# Patient Record
Sex: Male | Born: 1985 | Race: Black or African American | Hispanic: No | Marital: Single | Smoking: Current every day smoker
Health system: Southern US, Community
[De-identification: ages and names within clinical notes are randomized; demographics above are authoritative.]

---
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
## 2003-01-14 ENCOUNTER — Emergency Department (HOSPITAL_COMMUNITY): Admission: EM | Admit: 2003-01-14 | Discharge: 2003-01-14 | Payer: Self-pay | Admitting: *Deleted

## 2003-03-12 ENCOUNTER — Emergency Department (HOSPITAL_COMMUNITY): Admission: EM | Admit: 2003-03-12 | Discharge: 2003-03-12 | Payer: Self-pay | Admitting: Emergency Medicine

## 2003-09-17 ENCOUNTER — Emergency Department (HOSPITAL_COMMUNITY): Admission: EM | Admit: 2003-09-17 | Discharge: 2003-09-17 | Payer: Self-pay | Admitting: Emergency Medicine

## 2003-09-18 ENCOUNTER — Emergency Department (HOSPITAL_COMMUNITY): Admission: EM | Admit: 2003-09-18 | Discharge: 2003-09-19 | Payer: Self-pay | Admitting: *Deleted

## 2003-09-24 ENCOUNTER — Emergency Department (HOSPITAL_COMMUNITY): Admission: EM | Admit: 2003-09-24 | Discharge: 2003-09-24 | Payer: Self-pay | Admitting: Emergency Medicine

## 2003-10-04 ENCOUNTER — Emergency Department (HOSPITAL_COMMUNITY): Admission: EM | Admit: 2003-10-04 | Discharge: 2003-10-05 | Payer: Self-pay | Admitting: *Deleted

## 2003-10-08 ENCOUNTER — Emergency Department (HOSPITAL_COMMUNITY): Admission: EM | Admit: 2003-10-08 | Discharge: 2003-10-08 | Payer: Self-pay | Admitting: *Deleted

## 2003-12-07 ENCOUNTER — Emergency Department (HOSPITAL_COMMUNITY): Admission: EM | Admit: 2003-12-07 | Discharge: 2003-12-07 | Payer: Self-pay | Admitting: Emergency Medicine

## 2003-12-12 ENCOUNTER — Inpatient Hospital Stay (HOSPITAL_COMMUNITY): Admission: EM | Admit: 2003-12-12 | Discharge: 2003-12-14 | Payer: Self-pay | Admitting: Emergency Medicine

## 2005-07-15 ENCOUNTER — Emergency Department (HOSPITAL_COMMUNITY): Admission: EM | Admit: 2005-07-15 | Discharge: 2005-07-15 | Payer: Self-pay | Admitting: Emergency Medicine

## 2005-11-28 ENCOUNTER — Emergency Department (HOSPITAL_COMMUNITY): Admission: EM | Admit: 2005-11-28 | Discharge: 2005-11-28 | Payer: Self-pay | Admitting: Emergency Medicine

## 2006-08-31 ENCOUNTER — Emergency Department (HOSPITAL_COMMUNITY): Admission: EM | Admit: 2006-08-31 | Discharge: 2006-08-31 | Payer: Self-pay | Admitting: Emergency Medicine

## 2007-03-04 ENCOUNTER — Emergency Department (HOSPITAL_COMMUNITY): Admission: EM | Admit: 2007-03-04 | Discharge: 2007-03-04 | Payer: Self-pay | Admitting: Emergency Medicine

## 2007-06-03 ENCOUNTER — Emergency Department (HOSPITAL_COMMUNITY): Admission: EM | Admit: 2007-06-03 | Discharge: 2007-06-03 | Payer: Self-pay | Admitting: Emergency Medicine

## 2007-06-12 ENCOUNTER — Emergency Department (HOSPITAL_COMMUNITY): Admission: EM | Admit: 2007-06-12 | Discharge: 2007-06-12 | Payer: Self-pay | Admitting: Emergency Medicine

## 2007-12-01 ENCOUNTER — Emergency Department (HOSPITAL_COMMUNITY): Admission: EM | Admit: 2007-12-01 | Discharge: 2007-12-02 | Payer: Self-pay | Admitting: Emergency Medicine

## 2008-08-28 ENCOUNTER — Emergency Department (HOSPITAL_COMMUNITY): Admission: EM | Admit: 2008-08-28 | Discharge: 2008-08-28 | Payer: Self-pay | Admitting: Emergency Medicine

## 2008-11-08 ENCOUNTER — Emergency Department (HOSPITAL_COMMUNITY): Admission: EM | Admit: 2008-11-08 | Discharge: 2008-11-08 | Payer: Self-pay | Admitting: Emergency Medicine

## 2008-11-08 ENCOUNTER — Inpatient Hospital Stay (HOSPITAL_COMMUNITY): Admission: AD | Admit: 2008-11-08 | Discharge: 2008-11-09 | Payer: Self-pay | Admitting: *Deleted

## 2008-11-08 ENCOUNTER — Ambulatory Visit: Payer: Self-pay | Admitting: *Deleted

## 2009-01-01 ENCOUNTER — Emergency Department (HOSPITAL_COMMUNITY): Admission: EM | Admit: 2009-01-01 | Discharge: 2009-01-01 | Payer: Self-pay | Admitting: Emergency Medicine

## 2009-02-28 ENCOUNTER — Emergency Department (HOSPITAL_COMMUNITY): Admission: EM | Admit: 2009-02-28 | Discharge: 2009-02-28 | Payer: Self-pay | Admitting: Emergency Medicine

## 2009-10-17 ENCOUNTER — Emergency Department (HOSPITAL_COMMUNITY): Admission: EM | Admit: 2009-10-17 | Discharge: 2009-10-17 | Payer: Self-pay | Admitting: Emergency Medicine

## 2010-05-08 ENCOUNTER — Emergency Department (HOSPITAL_COMMUNITY): Admission: EM | Admit: 2010-05-08 | Discharge: 2010-05-08 | Payer: Self-pay | Admitting: Emergency Medicine

## 2010-10-06 LAB — COMPREHENSIVE METABOLIC PANEL
ALT: 26 U/L (ref 0–53)
Alkaline Phosphatase: 77 U/L (ref 39–117)
CO2: 23 mEq/L (ref 19–32)
Chloride: 107 mEq/L (ref 96–112)
GFR calc non Af Amer: >60 mL/min (ref >60)
Glucose, Bld: 114 mg/dL — ABNORMAL HIGH (ref 70–99)
Potassium: 3.4 mEq/L — ABNORMAL LOW (ref 3.5–5.1)
Sodium: 138 mEq/L (ref 135–145)

## 2010-10-06 LAB — DIFFERENTIAL
Basophils Absolute: 0.1 10*3/uL (ref 0.0–0.1)
Lymphocytes Relative: 14 % (ref 12–46)
Neutro Abs: 7.4 10*3/uL (ref 1.7–7.7)

## 2010-10-06 LAB — CBC
Hemoglobin: 15.5 g/dL (ref 13.0–17.0)
Platelets: 217 10*3/uL (ref 150–400)
RDW: 12.7 % (ref 11.5–15.5)
WBC: 9.3 10*3/uL (ref 4.0–10.5)

## 2010-10-06 LAB — SALICYLATE LEVEL: Salicylate Lvl: <4 mg/dL (ref 2.8–20.0)

## 2010-10-06 LAB — RAPID URINE DRUG SCREEN, HOSP PERFORMED
Barbiturates: NOT DETECTED
Benzodiazepines: NOT DETECTED
Cocaine: POSITIVE — AB

## 2010-10-06 LAB — POCT CARDIAC MARKERS: Troponin i, poc: <0.05 ng/mL (ref 0.00–0.09)

## 2010-10-09 LAB — RAPID URINE DRUG SCREEN, HOSP PERFORMED
Benzodiazepines: NOT DETECTED
Cocaine: NOT DETECTED
Tetrahydrocannabinol: POSITIVE — AB

## 2010-10-09 LAB — URINALYSIS, ROUTINE W REFLEX MICROSCOPIC
Glucose, UA: NEGATIVE mg/dL
Ketones, ur: NEGATIVE mg/dL
Leukocytes, UA: NEGATIVE
Protein, ur: NEGATIVE mg/dL

## 2010-10-09 LAB — BASIC METABOLIC PANEL
CO2: 22 mEq/L (ref 19–32)
Chloride: 108 mEq/L (ref 96–112)
Creatinine, Ser: 0.87 mg/dL (ref 0.4–1.5)
GFR calc Af Amer: >60 mL/min (ref >60)
Potassium: 3.5 mEq/L (ref 3.5–5.1)
Sodium: 137 mEq/L (ref 135–145)

## 2010-10-09 LAB — DIFFERENTIAL
Basophils Relative: 1 % (ref 0–1)
Eosinophils Absolute: 0.1 10*3/uL (ref 0.0–0.7)
Eosinophils Relative: 2 % (ref 0–5)
Monocytes Absolute: 0.6 10*3/uL (ref 0.1–1.0)
Monocytes Relative: 7 % (ref 3–12)

## 2010-10-09 LAB — CBC
HCT: 43.5 % (ref 39.0–52.0)
Hemoglobin: 15 g/dL (ref 13.0–17.0)
MCHC: 34.5 g/dL (ref 30.0–36.0)
MCV: 96.2 fL (ref 78.0–100.0)
RBC: 4.52 MIL/uL (ref 4.22–5.81)

## 2010-11-11 ENCOUNTER — Emergency Department (HOSPITAL_COMMUNITY): Payer: Self-pay

## 2010-11-11 ENCOUNTER — Emergency Department (HOSPITAL_COMMUNITY)
Admission: EM | Admit: 2010-11-11 | Discharge: 2010-11-11 | Disposition: A | Discharge status: Home / Self Care | Payer: Self-pay | Attending: Emergency Medicine | Admitting: Emergency Medicine

## 2010-11-11 DIAGNOSIS — W268XXA Contact with other sharp object(s), not elsewhere classified, initial encounter: Secondary | ICD-10-CM | POA: Insufficient documentation

## 2010-11-11 DIAGNOSIS — Z1881 Retained glass fragments: Secondary | ICD-10-CM | POA: Insufficient documentation

## 2010-11-11 DIAGNOSIS — S61409A Unspecified open wound of unspecified hand, initial encounter: Secondary | ICD-10-CM | POA: Insufficient documentation

## 2010-11-11 DIAGNOSIS — Y92009 Unspecified place in unspecified non-institutional (private) residence as the place of occurrence of the external cause: Secondary | ICD-10-CM | POA: Insufficient documentation
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
## 2010-12-16 ENCOUNTER — Inpatient Hospital Stay (HOSPITAL_COMMUNITY)
Admission: EM | Admit: 2010-12-16 | Discharge: 2010-12-20 | DRG: 132 | Disposition: A | Discharge status: Home / Self Care | Payer: Self-pay | Attending: Oral Surgery | Admitting: Oral Surgery

## 2010-12-16 DIAGNOSIS — S01502A Unspecified open wound of oral cavity, initial encounter: Secondary | ICD-10-CM | POA: Diagnosis present

## 2010-12-16 DIAGNOSIS — F17200 Nicotine dependence, unspecified, uncomplicated: Secondary | ICD-10-CM | POA: Diagnosis present

## 2010-12-16 DIAGNOSIS — S025XXA Fracture of tooth (traumatic), initial encounter for closed fracture: Secondary | ICD-10-CM | POA: Diagnosis present

## 2010-12-16 DIAGNOSIS — Y929 Unspecified place or not applicable: Secondary | ICD-10-CM

## 2010-12-16 DIAGNOSIS — S02600A Fracture of unspecified part of body of mandible, initial encounter for closed fracture: Principal | ICD-10-CM | POA: Diagnosis present

## 2010-12-16 LAB — BASIC METABOLIC PANEL
BUN: 9 mg/dL (ref 6–23)
Calcium: 9.5 mg/dL (ref 8.4–10.5)
Creatinine, Ser: 0.87 mg/dL (ref 0.50–1.35)
GFR calc Af Amer: >60 mL/min (ref >60)

## 2010-12-16 LAB — CBC
HCT: 45.6 % (ref 39.0–52.0)
MCH: 32.4 pg (ref 26.0–34.0)
MCV: 93.1 fL (ref 78.0–100.0)
Platelets: 230 10*3/uL (ref 150–400)
RDW: 12.4 % (ref 11.5–15.5)

## 2010-12-17 LAB — MRSA PCR SCREENING: MRSA by PCR: NEGATIVE
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
## 2010-12-19 ENCOUNTER — Inpatient Hospital Stay (HOSPITAL_COMMUNITY): Payer: Self-pay

## 2010-12-19 NOTE — Op Note (Signed)
NAMEOVADIA, LOPP NO.:  [id number]  MEDICAL RECORD NO.:  [id number]  LOCATION:  5037                         FACILITY:  [facility]  PHYSICIAN:  Georgia Lopes, M.D.  DATE OF BIRTH:  08/06/1985  DATE OF PROCEDURE:  12/18/2010 DATE OF DISCHARGE:                              OPERATIVE REPORT   PREOPERATIVE DIAGNOSIS:  Fractured left mandibular body and impacted tooth #17.  POSTOPERATIVE DIAGNOSIS:  Fractured left mandibular body and impacted tooth #17.  PROCEDURE:  Open reduction and internal fixation, left mandibular fracture, and extraction of tooth #17.  SURGEON:  Georgia Lopes, MD  ANESTHESIA:  General, Dr. Sampson Goon attending.  ASSISTANTS: 1. Luberta Mutter, DOMA 2. Jamie Cimler, DOMA  PROCEDURE IN DETAIL:  The patient was taken to the operating room and placed on the table in a supine position.  General anesthesia was administered intravenously and nasal endotracheal tube was placed and secured.  The eyes were protected.  The patient was prepped and draped for the procedure.  The oral portion was done first.  Posterior pharynx was suctioned and a throat pack was placed.  Lidocaine 2% with 1:100,000 epinephrine was infiltrated in the inferior alveolar block and buccal infiltration on the left mandible, total of 3 mL and then injection was made in the right and left maxillary piriform rim regions about the canine and in the mandible, in the area of the canine, and the buccal vestibule, a total of 10 mL was used for the oral injection.  The #15 blade was used to make a 1-cm incision at the right and left canine fossa area through mucosa down to the bone.  The periosteum was reflected with a Therapist, nutritional.  In the mandible, #15 blade was used to make an incision in the space between tooth #21, #22 near the attached mucosal tissue.  This incision was created also on the right side between teeth numbers 27 and 28.  The periosteum was reflected with  a Therapist, nutritional.  Then, with a Leibinger, maxillomandibular fixation screws 10 mm were placed in each of the dissected sites with the screwdriver, these were self-tapping screws.  Then a #15 blade was used to make a full-thickness incision overlying tooth #17 which was partially erupted.  The periosteum was reflected.  There was an obvious fracture noted at the alveolar buccal ridge.  The tooth was removed by removing bone around the tooth, elevating it with a 301 elevator, and then using the lower #150 forceps to remove it.  Socket was curetted and irrigated.  Then, the mandible was placed into good occlusion with the maxilla and 24-gauge wires were placed through the maxillomandibular fixation screw pins on the right and left side to place the mouth into occlusion and wire it shut.  Then, the patient's head was turned slightly and a marking pen was used to demarcate the palpated angle of the mandible and the body of the mandible where the fracture was felt to be.  Then, another line was made parallel to this, approximately 2 cm inferior and approximately 8 cm in length with 4 cm on each side where the fracture was felt to be.  Then, 2% lidocaine 1:100,000 epinephrine was infiltrated in this proposed incision site and good soft tissue sent to the periosteum and the bone area.  A total of 10 mL was utilized. The #15 blade was used make full-thickness incision through the skin and subcutaneous tissues.  The platysma was identified.  Dissection was carried into the platysma and a nerve stimulator was used to check for the marginal mandibular branch with facial nerve and the nerve was not identified.  There was no twitch and this muscle was sectioned with the #15 blade.  The dissection was carried through the soft tissues until the periosteum of the mandible was reached.  The periosteum was incised sharply with the #15 blade.  Periosteal elevator was used to reflect the periosteum and the  fracture was identified.  Dissection was carried out so that the inferior border of the mandible and a portion of the body of the mandible was freed out proximally and distally to the fracture. Then Kocher bone clamps were used and the fracture was found to reduce nicely.  Then, a 6-hole mandibular bone plate Leibinger was adapted to the body of the mandible into the inferior border and then the Stryker handpiece was used to create screw-holes and 2.3 screws were placed, 3 on either side, however, the distal screw closest to the fracture site was found to be freely rotating within its hole and a rescue emergency screw was placed, but this was also not tight, so it was presumed that the hole was too close to the fracture to engage adequate bone. However, the bone did reduce nicely after the screws were placed and then the incision area was irrigated, closed with 4-0 Vicryl, 3-0 chromic, and 4-0 Prolene for skin.  Then attention was turned back into the mouth.  The occlusion was wired together and these looked good. Then the intermaxillary fixation wires were cut and the mouth was opened.  The third molar socket was inspected and the superior border of mandible fracture site appeared to have near perfect reduction, so no additional work was done in this area.  The third molar site was irrigated and then closed with 3-0 chromic.  The incision sites where the maxillary fixation pins were placed were then noted and screwdriver was used to remove the maxillomandibular fixation.  Then these incisions were closed with 3-0 chromic.  Then the oral cavity was irrigated copiously.  Occlusion was checked one last time and found to be good. The throat pack was removed after suctioning the pharynx.  A sterile dressing was placed with bacitracin, Telfa, and Elastoplast and then the patient was extubated, taken to the recovery room breathing spontaneously in good condition.  ESTIMATED BLOOD LOSS:   Minimum.  COMPLICATIONS:  None.  SPECIMENS:  None.     Georgia Lopes, M.D.     SMJ/MEDQ  D:  12/18/2010  T:  12/19/2010  Job:  [id number]  Electronically Signed by Ocie Doyne M.D. on 12/19/2010 07:55:02 AM
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
## 2010-12-26 ENCOUNTER — Other Ambulatory Visit (HOSPITAL_COMMUNITY): Payer: Self-pay | Admitting: Oral Surgery

## 2010-12-26 DIAGNOSIS — R609 Edema, unspecified: Secondary | ICD-10-CM

## 2010-12-28 ENCOUNTER — Other Ambulatory Visit (HOSPITAL_COMMUNITY): Payer: Self-pay

## 2011-01-01 ENCOUNTER — Ambulatory Visit (HOSPITAL_COMMUNITY)
Admission: RE | Admit: 2011-01-01 | Discharge: 2011-01-01 | Disposition: A | Discharge status: Home / Self Care | Payer: Self-pay | Source: Ambulatory Visit | Attending: Oral Surgery | Admitting: Oral Surgery

## 2011-01-01 ENCOUNTER — Encounter (HOSPITAL_COMMUNITY): Payer: Self-pay

## 2011-01-01 ENCOUNTER — Other Ambulatory Visit (HOSPITAL_COMMUNITY): Payer: Self-pay

## 2011-01-01 DIAGNOSIS — R609 Edema, unspecified: Secondary | ICD-10-CM

## 2011-01-01 DIAGNOSIS — R209 Unspecified disturbances of skin sensation: Secondary | ICD-10-CM | POA: Insufficient documentation

## 2011-01-01 DIAGNOSIS — R220 Localized swelling, mass and lump, head: Secondary | ICD-10-CM | POA: Insufficient documentation

## 2011-01-01 MED ORDER — IOHEXOL 300 MG/ML  SOLN: 100.0000 mL | Freq: Once | INTRAMUSCULAR | Status: AC | PRN

## 2011-01-22 ENCOUNTER — Other Ambulatory Visit (HOSPITAL_COMMUNITY): Payer: Self-pay | Admitting: Oral Surgery

## 2011-01-22 DIAGNOSIS — B999 Unspecified infectious disease: Secondary | ICD-10-CM

## 2011-01-24 ENCOUNTER — Ambulatory Visit (HOSPITAL_COMMUNITY)
Admission: RE | Admit: 2011-01-24 | Discharge: 2011-01-24 | Disposition: A | Discharge status: Home / Self Care | Payer: Self-pay | Source: Ambulatory Visit | Attending: Oral Surgery | Admitting: Oral Surgery

## 2011-01-24 DIAGNOSIS — B999 Unspecified infectious disease: Secondary | ICD-10-CM

## 2011-01-24 DIAGNOSIS — R609 Edema, unspecified: Secondary | ICD-10-CM | POA: Insufficient documentation

## 2011-01-24 DIAGNOSIS — R220 Localized swelling, mass and lump, head: Secondary | ICD-10-CM | POA: Insufficient documentation

## 2011-01-24 DIAGNOSIS — Y831 Surgical operation with implant of artificial internal device as the cause of abnormal reaction of the patient, or of later complication, without mention of misadventure at the time of the procedure: Secondary | ICD-10-CM | POA: Insufficient documentation

## 2011-01-24 DIAGNOSIS — T8140XA Infection following a procedure, unspecified, initial encounter: Secondary | ICD-10-CM | POA: Insufficient documentation

## 2011-01-24 MED ORDER — IOHEXOL 300 MG/ML  SOLN
80.0000 mL | Freq: Once | INTRAMUSCULAR | Status: AC | PRN
  Administered 2011-01-24: 80 mL via INTRAVENOUS

## 2011-02-07 ENCOUNTER — Encounter (HOSPITAL_COMMUNITY)
Admission: RE | Admit: 2011-02-07 | Discharge: 2011-02-07 | Disposition: A | Discharge status: Home / Self Care | Payer: Self-pay | Source: Ambulatory Visit | Attending: Oral Surgery | Admitting: Oral Surgery

## 2011-02-07 ENCOUNTER — Other Ambulatory Visit (HOSPITAL_COMMUNITY): Payer: Self-pay | Admitting: Oral Surgery

## 2011-02-07 DIAGNOSIS — Z01812 Encounter for preprocedural laboratory examination: Secondary | ICD-10-CM | POA: Insufficient documentation

## 2011-02-07 DIAGNOSIS — B999 Unspecified infectious disease: Secondary | ICD-10-CM

## 2011-02-07 DIAGNOSIS — Z01818 Encounter for other preprocedural examination: Secondary | ICD-10-CM | POA: Insufficient documentation

## 2011-02-07 LAB — CBC
HCT: 43.4 % (ref 39.0–52.0)
Hemoglobin: 14.8 g/dL (ref 13.0–17.0)
MCV: 94.1 fL (ref 78.0–100.0)
WBC: 5.4 10*3/uL (ref 4.0–10.5)

## 2011-02-08 ENCOUNTER — Ambulatory Visit (HOSPITAL_COMMUNITY)
Admission: RE | Admit: 2011-02-08 | Discharge: 2011-02-08 | Disposition: A | Discharge status: Home / Self Care | Payer: Self-pay | Source: Ambulatory Visit | Attending: Oral Surgery | Admitting: Oral Surgery

## 2011-02-08 DIAGNOSIS — F17200 Nicotine dependence, unspecified, uncomplicated: Secondary | ICD-10-CM | POA: Insufficient documentation

## 2011-02-08 DIAGNOSIS — R220 Localized swelling, mass and lump, head: Secondary | ICD-10-CM | POA: Insufficient documentation

## 2011-02-08 DIAGNOSIS — Z01812 Encounter for preprocedural laboratory examination: Secondary | ICD-10-CM | POA: Insufficient documentation

## 2011-02-08 DIAGNOSIS — Z01818 Encounter for other preprocedural examination: Secondary | ICD-10-CM | POA: Insufficient documentation

## 2011-02-08 DIAGNOSIS — F411 Generalized anxiety disorder: Secondary | ICD-10-CM | POA: Insufficient documentation

## 2011-02-08 DIAGNOSIS — Z01810 Encounter for preprocedural cardiovascular examination: Secondary | ICD-10-CM | POA: Insufficient documentation

## 2011-02-08 DIAGNOSIS — IMO0002 Reserved for concepts with insufficient information to code with codable children: Secondary | ICD-10-CM | POA: Insufficient documentation

## 2011-02-10 LAB — CULTURE, ROUTINE-ABSCESS
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
## 2011-02-13 LAB — ANAEROBIC CULTURE

## 2011-03-28 LAB — DIFFERENTIAL
Basophils Absolute: 0
Basophils Relative: 0
Eosinophils Absolute: 0.1
Eosinophils Relative: 2
Lymphocytes Relative: 39
Monocytes Absolute: 0.5

## 2011-03-28 LAB — CBC
HCT: 44.6
Hemoglobin: 15.8
MCHC: 35.3
MCV: 95.2
RDW: 12.9

## 2011-03-28 LAB — RAPID URINE DRUG SCREEN, HOSP PERFORMED: Benzodiazepines: NOT DETECTED

## 2011-03-28 LAB — BASIC METABOLIC PANEL
CO2: 24
Chloride: 112
Glucose, Bld: 101 — ABNORMAL HIGH
Potassium: 3.7
Sodium: 143

## 2011-04-08 LAB — CBC
MCV: 95.3
RBC: 4.63
WBC: 6.7

## 2011-04-08 LAB — BASIC METABOLIC PANEL
Chloride: 112
Creatinine, Ser: 0.99
GFR calc Af Amer: >60

## 2011-04-08 LAB — DIFFERENTIAL
Lymphs Abs: 1.7
Monocytes Relative: 10
Neutro Abs: 4.2
Neutrophils Relative %: 63

## 2011-04-08 LAB — RAPID URINE DRUG SCREEN, HOSP PERFORMED
Amphetamines: NOT DETECTED
Benzodiazepines: NOT DETECTED
Cocaine: NOT DETECTED
Opiates: POSITIVE — AB
Tetrahydrocannabinol: POSITIVE — AB

## 2011-04-08 LAB — ETHANOL: Alcohol, Ethyl (B): 223 — ABNORMAL HIGH

## 2011-04-12 LAB — COMPREHENSIVE METABOLIC PANEL
AST: 30
Albumin: 4.7
Calcium: 9.6
Creatinine, Ser: 1.13
GFR calc Af Amer: >60
GFR calc non Af Amer: >60

## 2011-04-12 LAB — DIFFERENTIAL
Lymphocytes Relative: 32
Lymphs Abs: 1.9
Neutrophils Relative %: 56

## 2011-04-12 LAB — CBC
MCHC: 33.7
MCV: 95.6
Platelets: 230
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
## 2014-10-10 ENCOUNTER — Encounter (HOSPITAL_COMMUNITY): Payer: Self-pay | Admitting: Cardiology

## 2014-10-10 ENCOUNTER — Emergency Department (HOSPITAL_COMMUNITY): Payer: Self-pay

## 2014-10-10 ENCOUNTER — Emergency Department (HOSPITAL_COMMUNITY)
Admission: EM | Admit: 2014-10-10 | Discharge: 2014-10-10 | Disposition: A | Discharge status: Home / Self Care | Payer: Self-pay | Attending: Emergency Medicine | Admitting: Emergency Medicine

## 2014-10-10 DIAGNOSIS — Y9289 Other specified places as the place of occurrence of the external cause: Secondary | ICD-10-CM | POA: Insufficient documentation

## 2014-10-10 DIAGNOSIS — S52202A Unspecified fracture of shaft of left ulna, initial encounter for closed fracture: Secondary | ICD-10-CM

## 2014-10-10 DIAGNOSIS — Z79899 Other long term (current) drug therapy: Secondary | ICD-10-CM | POA: Insufficient documentation

## 2014-10-10 DIAGNOSIS — W228XXA Striking against or struck by other objects, initial encounter: Secondary | ICD-10-CM | POA: Insufficient documentation

## 2014-10-10 DIAGNOSIS — Y998 Other external cause status: Secondary | ICD-10-CM | POA: Insufficient documentation

## 2014-10-10 DIAGNOSIS — S51812A Laceration without foreign body of left forearm, initial encounter: Secondary | ICD-10-CM | POA: Insufficient documentation

## 2014-10-10 DIAGNOSIS — S52092A Other fracture of upper end of left ulna, initial encounter for closed fracture: Secondary | ICD-10-CM | POA: Insufficient documentation

## 2014-10-10 DIAGNOSIS — J45909 Unspecified asthma, uncomplicated: Secondary | ICD-10-CM | POA: Insufficient documentation

## 2014-10-10 DIAGNOSIS — Y9389 Activity, other specified: Secondary | ICD-10-CM | POA: Insufficient documentation

## 2014-10-10 MED ORDER — SULFAMETHOXAZOLE-TRIMETHOPRIM 800-160 MG PO TABS
1.0000 | ORAL_TABLET | Freq: Once | ORAL | Status: AC
  Administered 2014-10-10: 1 via ORAL
  Filled 2014-10-10: qty 1

## 2014-10-10 MED ORDER — CEPHALEXIN 500 MG PO CAPS: 500.0000 mg | ORAL_CAPSULE | Freq: Four times a day (QID) | ORAL | Status: DC

## 2014-10-10 MED ORDER — PROMETHAZINE HCL 12.5 MG PO TABS
12.5000 mg | ORAL_TABLET | Freq: Once | ORAL | Status: AC
  Administered 2014-10-10: 12.5 mg via ORAL
  Filled 2014-10-10: qty 1

## 2014-10-10 MED ORDER — SULFAMETHOXAZOLE-TRIMETHOPRIM 800-160 MG PO TABS: 1.0000 | ORAL_TABLET | Freq: Two times a day (BID) | ORAL | Status: AC

## 2014-10-10 MED ORDER — HYDROCODONE-ACETAMINOPHEN 7.5-325 MG PO TABS: 1.0000 | ORAL_TABLET | ORAL | Status: DC | PRN

## 2014-10-10 MED ORDER — IBUPROFEN 800 MG PO TABS
800.0000 mg | ORAL_TABLET | Freq: Once | ORAL | Status: AC
  Administered 2014-10-10: 800 mg via ORAL
  Filled 2014-10-10: qty 1

## 2014-10-10 MED ORDER — HYDROCODONE-ACETAMINOPHEN 5-325 MG PO TABS
2.0000 | ORAL_TABLET | Freq: Once | ORAL | Status: AC
  Administered 2014-10-10: 2 via ORAL
  Filled 2014-10-10: qty 2

## 2014-10-10 MED ORDER — BACITRACIN-NEOMYCIN-POLYMYXIN 400-5-5000 EX OINT
TOPICAL_OINTMENT | CUTANEOUS | Status: AC
  Filled 2014-10-10: qty 1

## 2014-10-10 MED ORDER — CEPHALEXIN 500 MG PO CAPS
500.0000 mg | ORAL_CAPSULE | Freq: Once | ORAL | Status: AC
  Administered 2014-10-10: 500 mg via ORAL
  Filled 2014-10-10: qty 1

## 2014-10-10 MED ORDER — BACITRACIN-NEOMYCIN-POLYMYXIN 400-5-5000 EX OINT
TOPICAL_OINTMENT | Freq: Once | CUTANEOUS | Status: AC
  Administered 2014-10-10: 14:00:00 via TOPICAL
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
## 2014-10-10 NOTE — ED Notes (Signed)
Pt has lac to lt forearm with dried blood around it.  Cleansed
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
## 2014-10-10 NOTE — Discharge Instructions (Signed)
You have a broken bone in your left forearm. You also have a laceration of your forearm. Please use the Bactrim and the Keflex daily with food until all taken. Please see the orthopedist listed above, or the orthopedist of your choice as sone as possible for evaluation of your broken bone. Please apply ice to the area until you're seen by the orthopedic specialist. You may use the ice in 15-20 minute periods. Forearm Fracture Your caregiver has diagnosed you as having a broken bone (fracture) of the forearm. This is the part of your arm between the elbow and your wrist. Your forearm is made up of two bones. These are the radius and ulna. A fracture is a break in one or both bones. A cast or splint is used to protect and keep your injured bone from moving. The cast or splint will be on generally for about 5 to 6 weeks, with individual variations. HOME CARE INSTRUCTIONS   Keep the injured part elevated while sitting or lying down. Keeping the injury above the level of your heart (the center of the chest). This will decrease swelling and pain.  Apply ice to the injury for 15-20 minutes, 03-04 times per day while awake, for 2 days. Put the ice in a plastic bag and place a thin towel between the bag of ice and your cast or splint.  If you have a plaster or fiberglass cast:  Do not try to scratch the skin under the cast using sharp or pointed objects.  Check the skin around the cast every day. You may put lotion on any red or sore areas.  Keep your cast dry and clean.  If you have a plaster splint:  Wear the splint as directed.  You may loosen the elastic around the splint if your fingers become numb, tingle, or turn cold or blue.  Do not put pressure on any part of your cast or splint. It may break. Rest your cast only on a pillow the first 24 hours until it is fully hardened.  Your cast or splint can be protected during bathing with a plastic bag. Do not lower the cast or splint into  water.  Only take over-the-counter or prescription medicines for pain, discomfort, or fever as directed by your caregiver. SEEK IMMEDIATE MEDICAL CARE IF:   Your cast gets damaged or breaks.  You have more severe pain or swelling than you did before the cast.  Your skin or nails below the injury turn blue or gray, or feel cold or numb.  There is a bad smell or new stains and/or pus like (purulent) drainage coming from under the cast. MAKE SURE YOU:   Understand these instructions.  Will watch your condition.  Will get help right away if you are not doing well or get worse. Document Released: 06/14/2000 Document Revised: 09/09/2011 Document Reviewed: 02/04/2008 [facility] Patient Information 2015 [place], [place]. This information is not intended to replace advice given to you by your health care provider. Make sure you discuss any questions you have with your health care provider.  Cast or Splint Care Casts and splints support injured limbs and keep bones from moving while they heal. It is important to care for your cast or splint at home.  HOME CARE INSTRUCTIONS  Keep the cast or splint uncovered during the drying period. It can take 24 to 48 hours to dry if it is made of plaster. A fiberglass cast will dry in less than 1 hour.  Do not  rest the cast on anything harder than a pillow for the first 24 hours.  Do not put weight on your injured limb or apply pressure to the cast until your health care provider gives you permission.  Keep the cast or splint dry. Wet casts or splints can lose their shape and may not support the limb as well. A wet cast that has lost its shape can also create harmful pressure on your skin when it dries. Also, wet skin can become infected.  Cover the cast or splint with a plastic bag when bathing or when out in the rain or snow. If the cast is on the trunk of the body, take sponge baths until the cast is removed.  If your cast does become wet, dry it with a  towel or a blow dryer on the cool setting only.  Keep your cast or splint clean. Soiled casts may be wiped with a moistened cloth.  Do not place any hard or soft foreign objects under your cast or splint, such as cotton, toilet paper, lotion, or powder.  Do not try to scratch the skin under the cast with any object. The object could get stuck inside the cast. Also, scratching could lead to an infection. If itching is a problem, use a blow dryer on a cool setting to relieve discomfort.  Do not trim or cut your cast or remove padding from inside of it.  Exercise all joints next to the injury that are not immobilized by the cast or splint. For example, if you have a long leg cast, exercise the hip joint and toes. If you have an arm cast or splint, exercise the shoulder, elbow, thumb, and fingers.  Elevate your injured arm or leg on 1 or 2 pillows for the first 1 to 3 days to decrease swelling and pain.It is best if you can comfortably elevate your cast so it is higher than your heart. SEEK MEDICAL CARE IF:   Your cast or splint cracks.  Your cast or splint is too tight or too loose.  You have unbearable itching inside the cast.  Your cast becomes wet or develops a soft spot or area.  You have a bad smell coming from inside your cast.  You get an object stuck under your cast.  Your skin around the cast becomes red or raw.  You have new pain or worsening pain after the cast has been applied. SEEK IMMEDIATE MEDICAL CARE IF:   You have fluid leaking through the cast.  You are unable to move your fingers or toes.  You have discolored (blue or white), cool, painful, or very swollen fingers or toes beyond the cast.  You have tingling or numbness around the injured area.  You have severe pain or pressure under the cast.  You have any difficulty with your breathing or have shortness of breath.  You have chest pain. Document Released: 06/14/2000 Document Revised: 04/07/2013 Document  Reviewed: 12/24/2012 [facility] Patient Information 2015 [place]. This information is not intended to replace advice given to you by your health care provider. Make sure you discuss any questions you have with your health care provider.

## 2014-10-25 ENCOUNTER — Encounter (HOSPITAL_COMMUNITY): Payer: Self-pay | Admitting: Emergency Medicine

## 2014-10-25 ENCOUNTER — Emergency Department (HOSPITAL_COMMUNITY)
Admission: EM | Admit: 2014-10-25 | Discharge: 2014-10-25 | Disposition: A | Discharge status: Home / Self Care | Payer: Self-pay | Attending: Emergency Medicine | Admitting: Emergency Medicine

## 2014-10-25 DIAGNOSIS — Z792 Long term (current) use of antibiotics: Secondary | ICD-10-CM | POA: Insufficient documentation

## 2014-10-25 DIAGNOSIS — J45909 Unspecified asthma, uncomplicated: Secondary | ICD-10-CM | POA: Insufficient documentation

## 2014-10-25 DIAGNOSIS — S52202D Unspecified fracture of shaft of left ulna, subsequent encounter for closed fracture with routine healing: Secondary | ICD-10-CM

## 2014-10-25 DIAGNOSIS — S52002D Unspecified fracture of upper end of left ulna, subsequent encounter for closed fracture with routine healing: Secondary | ICD-10-CM | POA: Insufficient documentation

## 2014-10-25 DIAGNOSIS — Z79899 Other long term (current) drug therapy: Secondary | ICD-10-CM | POA: Insufficient documentation

## 2014-10-25 DIAGNOSIS — X58XXXD Exposure to other specified factors, subsequent encounter: Secondary | ICD-10-CM | POA: Insufficient documentation

## 2014-10-25 MED ORDER — OXYCODONE-ACETAMINOPHEN 5-325 MG PO TABS
2.0000 | ORAL_TABLET | Freq: Once | ORAL | Status: AC
  Administered 2014-10-25: 2 via ORAL
  Filled 2014-10-25: qty 2

## 2014-10-25 MED ORDER — OXYCODONE-ACETAMINOPHEN 5-325 MG PO TABS: 1.0000 | ORAL_TABLET | Freq: Four times a day (QID) | ORAL | Status: DC | PRN
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
## 2014-10-25 NOTE — Discharge Instructions (Signed)
Cast or Splint Care °Casts and splints support injured limbs and keep bones from moving while they heal. It is important to care for your cast or splint at home.   °HOME CARE INSTRUCTIONS °· Keep the cast or splint uncovered during the drying period. It can take 24 to 48 hours to dry if it is made of plaster. A fiberglass cast will dry in less than 1 hour. °· Do not rest the cast on anything harder than a pillow for the first 24 hours. °· Do not put weight on your injured limb or apply pressure to the cast until your health care provider gives you permission. °· Keep the cast or splint dry. Wet casts or splints can lose their shape and may not support the limb as well. A wet cast that has lost its shape can also create harmful pressure on your skin when it dries. Also, wet skin can become infected. °¨ Cover the cast or splint with a plastic bag when bathing or when out in the rain or snow. If the cast is on the trunk of the body, take sponge baths until the cast is removed. °¨ If your cast does become wet, dry it with a towel or a blow dryer on the cool setting only. °· Keep your cast or splint clean. Soiled casts may be wiped with a moistened cloth. °· Do not place any hard or soft foreign objects under your cast or splint, such as cotton, toilet paper, lotion, or powder. °· Do not try to scratch the skin under the cast with any object. The object could get stuck inside the cast. Also, scratching could lead to an infection. If itching is a problem, use a blow dryer on a cool setting to relieve discomfort. °· Do not trim or cut your cast or remove padding from inside of it. °· Exercise all joints next to the injury that are not immobilized by the cast or splint. For example, if you have a long leg cast, exercise the hip joint and toes. If you have an arm cast or splint, exercise the shoulder, elbow, thumb, and fingers. °· Elevate your injured arm or leg on 1 or 2 pillows for the first 1 to 3 days to decrease  swelling and pain. It is best if you can comfortably elevate your cast so it is higher than your heart. °SEEK MEDICAL CARE IF:  °· Your cast or splint cracks. °· Your cast or splint is too tight or too loose. °· You have unbearable itching inside the cast. °· Your cast becomes wet or develops a soft spot or area. °· You have a bad smell coming from inside your cast. °· You get an object stuck under your cast. °· Your skin around the cast becomes red or raw. °· You have new pain or worsening pain after the cast has been applied. °SEEK IMMEDIATE MEDICAL CARE IF:  °· You have fluid leaking through the cast. °· You are unable to move your fingers or toes. °· You have discolored (blue or white), cool, painful, or very swollen fingers or toes beyond the cast. °· You have tingling or numbness around the injured area. °· You have severe pain or pressure under the cast. °· You have any difficulty with your breathing or have shortness of breath. °· You have chest pain. °Document Released: 06/14/2000 Document Revised: 04/07/2013 Document Reviewed: 12/24/2012 °[employer]® Patient Information ©2015 [employer]. This information is not intended to replace advice given to you by your health care   provider. Make sure you discuss any questions you have with your health care provider.   RICE: Routine Care for Injuries The routine care of many injuries includes Rest, Ice, Compression, and Elevation (RICE). HOME CARE INSTRUCTIONS  Rest is needed to allow your body to heal. Routine activities can usually be resumed when comfortable. Injured tendons and bones can take up to 6 weeks to heal. Tendons are the cord-like structures that attach muscle to bone.  Ice following an injury helps keep the swelling down and reduces pain.  Put ice in a plastic bag.  Place a towel between your skin and the bag.  Leave the ice on for 15-20 minutes, 3-4 times a day, or as directed by your health care provider. Do this while awake, for the  first 24 to 48 hours. After that, continue as directed by your caregiver.  Compression helps keep swelling down. It also gives support and helps with discomfort. If an elastic bandage has been applied, it should be removed and reapplied every 3 to 4 hours. It should not be applied tightly, but firmly enough to keep swelling down. Watch fingers or toes for swelling, bluish discoloration, coldness, numbness, or excessive pain. If any of these problems occur, remove the bandage and reapply loosely. Contact your caregiver if these problems continue.  Elevation helps reduce swelling and decreases pain. With extremities, such as the arms, hands, legs, and feet, the injured area should be placed near or above the level of the heart, if possible. SEEK IMMEDIATE MEDICAL CARE IF:  You have persistent pain and swelling.  You develop redness, numbness, or unexpected weakness.  Your symptoms are getting worse rather than improving after several days. These symptoms may indicate that further evaluation or further X-rays are needed. Sometimes, X-rays may not show a small broken bone (fracture) until 1 week or 10 days later. Make a follow-up appointment with your caregiver. Ask when your X-ray results will be ready. Make sure you get your X-ray results. Document Released: 09/29/2000 Document Revised: 06/22/2013 Document Reviewed: 11/16/2010 [facility] Patient Information 2015 [place], [place]. This information is not intended to replace advice given to you by your health care provider. Make sure you discuss any questions you have with your health care provider.   Ulnar Fracture You have a fracture (broken bone) of the forearm. This is the part of your arm between the elbow and your wrist. Your forearm is made up of two bones. These are the radius and ulna. Your fracture is in the ulna. This is the bone in your forearm located on the little finger side of your forearm. A cast or splint is used to protect and keep your  injured bone from moving. The cast or splint will be on generally for about 5 to 6 weeks, with individual variations. HOME CARE INSTRUCTIONS   Keep the injured part elevated while sitting or lying down. Keep the injury above the level of your heart (the center of the chest). This will decrease swelling and pain.  Apply ice to the injury for 15-20 minutes, 03-04 times per day while awake, for 2 days. Put the ice in a plastic bag and place a towel between the bag of ice and your cast or splint.  Move your fingers to avoid stiffness and minimize swelling.  If you have a plaster or fiberglass cast:  Do not try to scratch the skin under the cast using sharp or pointed objects.  Check the skin around the cast every day. You may  put lotion on any red or sore areas.  Keep your cast dry and clean.  If you have a plaster splint:  Wear the splint as directed.  You may loosen the elastic around the splint if your fingers become numb, tingle, or turn cold or blue.  Do not put pressure on any part of your cast or splint. It may break. Rest your cast only on a pillow the first 24 hours until it is fully hardened.  Your cast or splint can be protected during bathing with a plastic bag. Do not lower the cast or splint into water.  Only take over-the-counter or prescription medicines for pain, discomfort, or fever as directed by your caregiver. SEEK IMMEDIATE MEDICAL CARE IF:   Your cast gets damaged or breaks.  You have more severe pain or swelling than you did before the cast.  You have severe pain when stretching your fingers.  There is a bad smell or new stains and/or purulent (pus like) drainage coming from under the cast. Document Released: 11/28/2005 Document Revised: 09/09/2011 Document Reviewed: 05/02/2007 [employer] Patient Information 2015 [place]. This information is not intended to replace advice given to you by your health care provider. Make sure you discuss any questions you  have with your health care provider.

## 2014-10-25 NOTE — ED Notes (Signed)
MD at bedside. 
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
## 2014-10-26 ENCOUNTER — Telehealth: Payer: Self-pay | Admitting: Orthopedic Surgery
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
## 2014-10-26 NOTE — Telephone Encounter (Signed)
FYI, thank you.

## 2014-10-26 NOTE — Telephone Encounter (Signed)
Ok   Do you want me to answer a Q? Or is this an FYI???

## 2014-10-27 ENCOUNTER — Ambulatory Visit: Payer: Self-pay | Admitting: Orthopedic Surgery

## 2014-11-17 ENCOUNTER — Ambulatory Visit: Payer: Self-pay | Admitting: Orthopedic Surgery

## 2014-11-23 ENCOUNTER — Encounter: Payer: Self-pay | Admitting: Orthopedic Surgery

## 2014-11-27 ENCOUNTER — Emergency Department (HOSPITAL_COMMUNITY): Payer: Self-pay

## 2014-11-27 ENCOUNTER — Emergency Department (HOSPITAL_COMMUNITY)
Admission: EM | Admit: 2014-11-27 | Discharge: 2014-11-27 | Disposition: A | Discharge status: Home / Self Care | Payer: Self-pay | Attending: Emergency Medicine | Admitting: Emergency Medicine

## 2014-11-27 ENCOUNTER — Encounter (HOSPITAL_COMMUNITY): Payer: Self-pay

## 2014-11-27 DIAGNOSIS — F10929 Alcohol use, unspecified with intoxication, unspecified: Secondary | ICD-10-CM

## 2014-11-27 DIAGNOSIS — Z79899 Other long term (current) drug therapy: Secondary | ICD-10-CM | POA: Insufficient documentation

## 2014-11-27 DIAGNOSIS — W260XXA Contact with knife, initial encounter: Secondary | ICD-10-CM | POA: Insufficient documentation

## 2014-11-27 DIAGNOSIS — Z720 Tobacco use: Secondary | ICD-10-CM | POA: Insufficient documentation

## 2014-11-27 DIAGNOSIS — Y9289 Other specified places as the place of occurrence of the external cause: Secondary | ICD-10-CM | POA: Insufficient documentation

## 2014-11-27 DIAGNOSIS — Y998 Other external cause status: Secondary | ICD-10-CM | POA: Insufficient documentation

## 2014-11-27 DIAGNOSIS — Z792 Long term (current) use of antibiotics: Secondary | ICD-10-CM | POA: Insufficient documentation

## 2014-11-27 DIAGNOSIS — F10120 Alcohol abuse with intoxication, uncomplicated: Secondary | ICD-10-CM | POA: Insufficient documentation

## 2014-11-27 DIAGNOSIS — Y9389 Activity, other specified: Secondary | ICD-10-CM | POA: Insufficient documentation

## 2014-11-27 DIAGNOSIS — S61411A Laceration without foreign body of right hand, initial encounter: Secondary | ICD-10-CM | POA: Insufficient documentation

## 2014-11-27 DIAGNOSIS — R000 Tachycardia, unspecified: Secondary | ICD-10-CM | POA: Insufficient documentation

## 2014-11-27 DIAGNOSIS — J45909 Unspecified asthma, uncomplicated: Secondary | ICD-10-CM | POA: Insufficient documentation

## 2014-11-27 MED ORDER — ACETAMINOPHEN 325 MG PO TABS
650.0000 mg | ORAL_TABLET | Freq: Once | ORAL | Status: AC
  Administered 2014-11-27: 650 mg via ORAL

## 2014-11-27 MED ORDER — IBUPROFEN 400 MG PO TABS
600.0000 mg | ORAL_TABLET | Freq: Once | ORAL | Status: AC
  Administered 2014-11-27: 600 mg via ORAL
  Filled 2014-11-27: qty 2

## 2014-11-27 MED ORDER — ACETAMINOPHEN 325 MG PO TABS
ORAL_TABLET | ORAL | Status: AC
  Filled 2014-11-27: qty 2

## 2014-11-27 MED ORDER — IBUPROFEN 400 MG PO TABS
ORAL_TABLET | ORAL | Status: AC
  Filled 2014-11-27: qty 1

## 2014-11-27 MED ORDER — BUPIVACAINE HCL (PF) 0.5 % IJ SOLN
INTRAMUSCULAR | Status: AC
  Administered 2014-11-27: 03:00:00
  Filled 2014-11-27: qty 30

## 2014-11-27 NOTE — ED Notes (Signed)
Patient via [facility] c/o laceration to right hand. Per ems-large amount of blood loss. Bleeding controled at this time, and dressing in place by EMS. ETOH on board.

## 2014-11-27 NOTE — ED Notes (Addendum)
Pt in and out of room

## 2014-11-27 NOTE — Discharge Instructions (Signed)
Keep the wound clean and dry. Change the dressing daily. You can use triple antibiotic ointment on the wound to help healing and to prevent infection.  Elevate your hand. Use ice packs for pain and to stop bleeding and swelling. You can take ibuprofen 600 mg with acetaminophen 650 mg 4 times a day for pain if needed. The sutures need to be removed in 10 days. You should have your hand rechecked by a hand specialist to evaluate your hand if you continue to have numbness in your 3 fingers, call [facility] to get an appointment.    Sutured Wound Care Sutures are stitches that can be used to close wounds. Caring for your wound can help stop infection and lessen pain. HOME CARE   Rest and raise (elevate) the injured area until the pain and puffiness (swelling) go away.  Only take medicines as told by your doctor.  Clean the wound gently with mild soap and water once a day after the first 2 days. Rinse off the soap. Pat the area dry with a clean towel. Do not rub the wound.  Change the bandage (dressing) as told by your doctor. If the bandage sticks, soak it off with soapy water. Stop using a bandage after 2 days or after the wound stops leaking fluid.  Put cream on the wound as told by your doctor.  Do not stretch the wound.  Drink enough fluids to keep your pee (urine) clear or pale yellow.  See your doctor to have the sutures removed.  Use sunscreen or sunblock on the wound after it heals. GET HELP RIGHT AWAY IF:   Your wound gets red, puffy, hot, or tender.  You have more pain in the wound.  You have a red streak that goes away from the wound.  You see yellowish-white fluid (pus) coming out of the wound.  You have a fever.  You have chills and start to shake.  You notice a bad smell coming from the wound.  Your wound will not stop bleeding. MAKE SURE YOU:   Understand these instructions.  Will watch your condition.  Will get help right away if you are not  doing well or get worse. Document Released: 12/04/2007 Document Revised: 09/09/2011 Document Reviewed: 10/21/2010 [facility] Patient Information 2015 [employer] [place]. This information is not intended to replace advice given to you by your health care provider. Make sure you discuss any questions you have with your health care provider.
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
## 2014-12-30 ENCOUNTER — Encounter (HOSPITAL_COMMUNITY): Payer: Self-pay

## 2014-12-30 ENCOUNTER — Inpatient Hospital Stay (HOSPITAL_COMMUNITY)
Admission: AD | Admit: 2014-12-30 | Discharge: 2014-12-31 | DRG: 897 | Disposition: A | Discharge status: Home / Self Care | Payer: No Typology Code available for payment source | Source: Intra-hospital | Attending: Psychiatry | Admitting: Psychiatry

## 2014-12-30 ENCOUNTER — Emergency Department (HOSPITAL_COMMUNITY)
Admission: EM | Admit: 2014-12-30 | Discharge: 2014-12-30 | Disposition: A | Discharge status: Other Acute Inpatient Hospital | Payer: Self-pay | Attending: Emergency Medicine | Admitting: Emergency Medicine

## 2014-12-30 DIAGNOSIS — F1210 Cannabis abuse, uncomplicated: Secondary | ICD-10-CM | POA: Insufficient documentation

## 2014-12-30 DIAGNOSIS — F10129 Alcohol abuse with intoxication, unspecified: Secondary | ICD-10-CM | POA: Diagnosis present

## 2014-12-30 DIAGNOSIS — F10120 Alcohol abuse with intoxication, uncomplicated: Secondary | ICD-10-CM | POA: Insufficient documentation

## 2014-12-30 DIAGNOSIS — F329 Major depressive disorder, single episode, unspecified: Secondary | ICD-10-CM | POA: Diagnosis present

## 2014-12-30 DIAGNOSIS — X58XXXA Exposure to other specified factors, initial encounter: Secondary | ICD-10-CM | POA: Insufficient documentation

## 2014-12-30 DIAGNOSIS — Y906 Blood alcohol level of 120-199 mg/100 ml: Secondary | ICD-10-CM | POA: Diagnosis present

## 2014-12-30 DIAGNOSIS — Z792 Long term (current) use of antibiotics: Secondary | ICD-10-CM | POA: Insufficient documentation

## 2014-12-30 DIAGNOSIS — T424X4A Poisoning by benzodiazepines, undetermined, initial encounter: Secondary | ICD-10-CM | POA: Insufficient documentation

## 2014-12-30 DIAGNOSIS — J45909 Unspecified asthma, uncomplicated: Secondary | ICD-10-CM | POA: Insufficient documentation

## 2014-12-30 DIAGNOSIS — Y9289 Other specified places as the place of occurrence of the external cause: Secondary | ICD-10-CM | POA: Insufficient documentation

## 2014-12-30 DIAGNOSIS — Y998 Other external cause status: Secondary | ICD-10-CM | POA: Insufficient documentation

## 2014-12-30 DIAGNOSIS — F17210 Nicotine dependence, cigarettes, uncomplicated: Secondary | ICD-10-CM | POA: Diagnosis present

## 2014-12-30 DIAGNOSIS — T50904A Poisoning by unspecified drugs, medicaments and biological substances, undetermined, initial encounter: Secondary | ICD-10-CM

## 2014-12-30 DIAGNOSIS — Z79899 Other long term (current) drug therapy: Secondary | ICD-10-CM | POA: Insufficient documentation

## 2014-12-30 DIAGNOSIS — F10920 Alcohol use, unspecified with intoxication, uncomplicated: Secondary | ICD-10-CM

## 2014-12-30 DIAGNOSIS — Z720 Tobacco use: Secondary | ICD-10-CM | POA: Insufficient documentation

## 2014-12-30 DIAGNOSIS — Y9389 Activity, other specified: Secondary | ICD-10-CM | POA: Insufficient documentation

## 2014-12-30 DIAGNOSIS — F1010 Alcohol abuse, uncomplicated: Secondary | ICD-10-CM | POA: Diagnosis present

## 2014-12-30 LAB — CBC WITH DIFFERENTIAL/PLATELET
Basophils Absolute: 0 10*3/uL (ref 0.0–0.1)
Basophils Relative: 1 % (ref 0–1)
Eosinophils Absolute: 0.2 10*3/uL (ref 0.0–0.7)
Eosinophils Relative: 4 % (ref 0–5)
HCT: 43.5 % (ref 39.0–52.0)
Hemoglobin: 14.4 g/dL (ref 13.0–17.0)
Lymphocytes Relative: 35 % (ref 12–46)
Lymphs Abs: 1.8 10*3/uL (ref 0.7–4.0)
MCH: 31.6 pg (ref 26.0–34.0)
MCHC: 33.1 g/dL (ref 30.0–36.0)
MCV: 95.6 fL (ref 78.0–100.0)
Monocytes Absolute: 0.3 10*3/uL (ref 0.1–1.0)
Monocytes Relative: 6 % (ref 3–12)
Neutro Abs: 2.9 10*3/uL (ref 1.7–7.7)
Neutrophils Relative %: 54 % (ref 43–77)
Platelets: 202 10*3/uL (ref 150–400)
RBC: 4.55 MIL/uL (ref 4.22–5.81)
RDW: 12.9 % (ref 11.5–15.5)
WBC: 5.3 10*3/uL (ref 4.0–10.5)

## 2014-12-30 LAB — BASIC METABOLIC PANEL
Anion gap: 6 (ref 5–15)
BUN: 7 mg/dL (ref 6–20)
CO2: 24 mmol/L (ref 22–32)
Calcium: 8.7 mg/dL — ABNORMAL LOW (ref 8.9–10.3)
Chloride: 109 mmol/L (ref 101–111)
Creatinine, Ser: 0.92 mg/dL (ref 0.61–1.24)
GFR calc Af Amer: >60 mL/min (ref >60)
GFR calc non Af Amer: >60 mL/min (ref >60)
Glucose, Bld: 102 mg/dL — ABNORMAL HIGH (ref 65–99)
Potassium: 3.4 mmol/L — ABNORMAL LOW (ref 3.5–5.1)
Sodium: 139 mmol/L (ref 135–145)

## 2014-12-30 LAB — RAPID URINE DRUG SCREEN, HOSP PERFORMED
Amphetamines: NOT DETECTED
Barbiturates: NOT DETECTED
Benzodiazepines: NOT DETECTED
Cocaine: NOT DETECTED
Opiates: NOT DETECTED
Tetrahydrocannabinol: POSITIVE — AB

## 2014-12-30 LAB — ETHANOL: Alcohol, Ethyl (B): 124 mg/dL — ABNORMAL HIGH (ref <5)

## 2014-12-30 MED ORDER — MAGNESIUM HYDROXIDE 400 MG/5ML PO SUSP: 30.0000 mL | Freq: Every day | ORAL | Status: DC | PRN

## 2014-12-30 MED ORDER — LOPERAMIDE HCL 2 MG PO CAPS: 2.0000 mg | ORAL_CAPSULE | ORAL | Status: DC | PRN

## 2014-12-30 MED ORDER — NICOTINE 21 MG/24HR TD PT24
21.0000 mg | MEDICATED_PATCH | Freq: Every day | TRANSDERMAL | Status: DC
  Filled 2014-12-30: qty 1

## 2014-12-30 MED ORDER — TRAZODONE HCL 50 MG PO TABS
50.0000 mg | ORAL_TABLET | Freq: Every evening | ORAL | Status: DC | PRN
  Administered 2014-12-30: 50 mg via ORAL
  Filled 2014-12-30: qty 1

## 2014-12-30 MED ORDER — CHLORDIAZEPOXIDE HCL 25 MG PO CAPS: 25.0000 mg | ORAL_CAPSULE | Freq: Four times a day (QID) | ORAL | Status: DC | PRN

## 2014-12-30 MED ORDER — THIAMINE HCL 100 MG/ML IJ SOLN
100.0000 mg | Freq: Once | INTRAMUSCULAR | Status: AC
  Administered 2014-12-30: 100 mg via INTRAMUSCULAR
  Filled 2014-12-30: qty 2

## 2014-12-30 MED ORDER — ALUM & MAG HYDROXIDE-SIMETH 200-200-20 MG/5ML PO SUSP: 30.0000 mL | ORAL | Status: DC | PRN

## 2014-12-30 MED ORDER — LORAZEPAM 1 MG PO TABS
1.0000 mg | ORAL_TABLET | Freq: Once | ORAL | Status: AC
  Administered 2014-12-30: 1 mg via ORAL
  Filled 2014-12-30: qty 1

## 2014-12-30 MED ORDER — NICOTINE 21 MG/24HR TD PT24
21.0000 mg | MEDICATED_PATCH | Freq: Every day | TRANSDERMAL | Status: DC
  Filled 2014-12-30 (×4): qty 1

## 2014-12-30 MED ORDER — ONDANSETRON 4 MG PO TBDP: 4.0000 mg | ORAL_TABLET | Freq: Four times a day (QID) | ORAL | Status: DC | PRN

## 2014-12-30 MED ORDER — CHLORDIAZEPOXIDE HCL 25 MG PO CAPS: 25.0000 mg | ORAL_CAPSULE | ORAL | Status: DC

## 2014-12-30 MED ORDER — POVIDONE-IODINE 10 % EX SOLN
CUTANEOUS | Status: AC
  Filled 2014-12-30: qty 118

## 2014-12-30 MED ORDER — CHLORDIAZEPOXIDE HCL 25 MG PO CAPS: 25.0000 mg | ORAL_CAPSULE | Freq: Every day | ORAL | Status: DC

## 2014-12-30 MED ORDER — VITAMIN B-1 100 MG PO TABS
100.0000 mg | ORAL_TABLET | Freq: Every day | ORAL | Status: DC
  Administered 2014-12-31: 100 mg via ORAL
  Filled 2014-12-30 (×3): qty 1

## 2014-12-30 MED ORDER — ADULT MULTIVITAMIN W/MINERALS CH
1.0000 | ORAL_TABLET | Freq: Every day | ORAL | Status: DC
  Administered 2014-12-30 – 2014-12-31 (×2): 1 via ORAL
  Filled 2014-12-30 (×5): qty 1

## 2014-12-30 MED ORDER — HYDROXYZINE HCL 25 MG PO TABS
25.0000 mg | ORAL_TABLET | Freq: Four times a day (QID) | ORAL | Status: DC | PRN
  Administered 2014-12-30 – 2014-12-31 (×2): 25 mg via ORAL
  Filled 2014-12-30 (×2): qty 1

## 2014-12-30 MED ORDER — ALBUTEROL SULFATE HFA 108 (90 BASE) MCG/ACT IN AERS: 1.0000 | INHALATION_SPRAY | Freq: Four times a day (QID) | RESPIRATORY_TRACT | Status: DC | PRN

## 2014-12-30 MED ORDER — CHLORDIAZEPOXIDE HCL 25 MG PO CAPS
25.0000 mg | ORAL_CAPSULE | Freq: Four times a day (QID) | ORAL | Status: DC
  Administered 2014-12-30 – 2014-12-31 (×4): 25 mg via ORAL
  Filled 2014-12-30 (×5): qty 1

## 2014-12-30 MED ORDER — CHLORDIAZEPOXIDE HCL 25 MG PO CAPS: 25.0000 mg | ORAL_CAPSULE | Freq: Three times a day (TID) | ORAL | Status: DC

## 2014-12-30 MED ORDER — ACETAMINOPHEN 325 MG PO TABS: 650.0000 mg | ORAL_TABLET | Freq: Four times a day (QID) | ORAL | Status: DC | PRN

## 2014-12-30 NOTE — BH Assessment (Signed)
Unable to complete assessment at this time. Spoke with Jonny RuizJohn at [facility] who reported that pt is asleep and shared that pt slept through a lab draw. This Clinical research associatewriter inquired about pt receiving sleeping medication. Pt was not given any medication for sleep; however it has been documented that pt reported taking 20 Klonopin prior to arriving in the ED. TTS will assess pt when pt is alert and able to participate in the assessment.

## 2014-12-30 NOTE — ED Notes (Signed)
Sutures removed from patients right hand. Patient states sutures are two weeks old, hand evaluated by EDP. Sutures removed without difficulty.

## 2014-12-30 NOTE — ED Notes (Signed)
Patient wanting to go outside and smoke. Patient advised of policies. Patient also asking if he can go home as he feels he can manage stress at home. Pt. Finished telepych and [place]

## 2014-12-30 NOTE — ED Notes (Signed)
Pt reports that he took 20 "of the yellow Klonopin".
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
## 2014-12-30 NOTE — ED Notes (Signed)
[facility].
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
## 2014-12-30 NOTE — ED Notes (Signed)
Pt has denied being suicidal or homicidal. States he just took the pills "because I'm depressed".

## 2014-12-30 NOTE — Progress Notes (Signed)
Did not attend group tonight, visitor came later and was in the room talking.

## 2014-12-30 NOTE — ED Notes (Signed)
Spoke with TTS.  Telepsych assessment to be completed after 7 am.

## 2014-12-30 NOTE — ED Notes (Signed)
Pt states he klonopin pills x 20.   Pt states he is suicidal and homicidal.

## 2014-12-30 NOTE — Progress Notes (Signed)
CSW informed ED staff the telepsych cart will be called in 5 minutes.     Maryelizabeth Rowanressa Tavari Loadholt, MSW, LCSW, LCAS Clinical Social Worker [phone]

## 2014-12-30 NOTE — Progress Notes (Signed)
TTS assessment is pending- CSW contacted [facility] ED Nurse who advised patient is awake and alert to participate in tele psych eval. TTS staff advised-  Reece LevyJanet Corean Yoshimura, MSW, Theresia MajorsLCSWA  [phone]

## 2014-12-30 NOTE — ED Provider Notes (Signed)
Plan is for [facility] to [facility] once a bed is available.  They expect that to happen this afternoon.  I was asked to re enter his consult order.  Linwood DibblesJon Babak Lucus, MD 12/30/14 1359

## 2014-12-30 NOTE — ED Provider Notes (Signed)
CSN: [id number]     Arrival date & time 12/30/14  0217 History   First MD Initiated Contact with Patient 12/30/14 [phone]     Chief Complaint  Patient presents with  . V70.1     (Consider location/radiation/quality/duration/timing/severity/associated sxs/prior Treatment) HPI   29yM with alcohol abuse. Says he wants help. Last drank earlier today. Also reports took 20 klonopin. Wont tell me specifically what his intent was with this. Endorses depression. Also says "I want to kill everyone." Pt has very slurred speech and laughing inappropriately consistent with heavy alcohol intoxication. Unreliable historian currently.   Past Medical History  Diagnosis Date  . Asthma    History reviewed. No pertinent past surgical history. No family history on file. History  Substance Use Topics  . Smoking status: Current Every Day Smoker -- 1.00 packs/day    Types: Cigarettes  . Smokeless tobacco: Not on file  . Alcohol Use: Yes     Comment: drunk now    Review of Systems  Level 5 caveat because patient is intoxicated  Allergies  Review of patient's allergies indicates no known allergies.  Home Medications   Prior to Admission medications   Medication Sig Start Date End Date Taking? Authorizing Provider  albuterol (PROVENTIL HFA;VENTOLIN HFA) 108 (90 BASE) MCG/ACT inhaler Inhale 1-2 puffs into the lungs every 6 (six) hours as needed for wheezing or shortness of breath.   Yes Historical Provider, MD  cephALEXin (KEFLEX) 500 MG capsule Take 1 capsule (500 mg total) by mouth 4 (four) times daily. 10/10/14   Ivery Quale, PA-C  HYDROcodone-acetaminophen (NORCO) 7.5-325 MG per tablet Take 1 tablet by mouth every 4 (four) hours as needed. 10/10/14   Ivery Quale, PA-C  oxyCODONE-acetaminophen (PERCOCET/ROXICET) 5-325 MG per tablet Take 1-2 tablets by mouth every 6 (six) hours as needed. 10/25/14   Kristen N Ward, DO   BP 139/101 mmHg  Pulse 116  Temp(Src) 97.8 F (36.6 C) (Oral)  Resp 20  Ht   (1.753 m)  Wt 205 lb (92.987 kg)  BMI 30.26 kg/m2  SpO2 100% Physical Exam  Constitutional: He appears well-developed and well-nourished. No distress.  HENT:  Head: Normocephalic and atraumatic.  Eyes: Conjunctivae are normal. Right eye exhibits no discharge. Left eye exhibits no discharge.  Neck: Neck supple.  Cardiovascular: Normal rate, regular rhythm and normal heart sounds.  Exam reveals no gallop and no friction rub.   No murmur heard. Pulmonary/Chest: Effort normal and breath sounds normal. No respiratory distress.  Abdominal: Soft. He exhibits no distension. There is no tenderness.  Musculoskeletal: He exhibits no edema or tenderness.  Neurological:  Awake, but drowsy. Slurred speech. General psychomotor slowing consistent with alcohol intoxication. Focal motor deficit.  Skin: Skin is warm and dry.  Psychiatric:  Clinically intoxicated. Slurred speech. Inappropriate laughing at times.  Nursing note and vitals reviewed.   ED Course  Procedures (including critical care time) Labs Review Labs Reviewed  BASIC METABOLIC PANEL - Abnormal; Notable for the following:    Potassium 3.4 (*)    Glucose, Bld 102 (*)    Calcium 8.7 (*)    All other components within normal limits  ETHANOL - Abnormal; Notable for the following:    Alcohol, Ethyl (B) 124 (*)    All other components within normal limits  CBC WITH DIFFERENTIAL/PLATELET  URINE RAPID DRUG SCREEN, HOSP PERFORMED    Imaging Review No results found.   EKG Interpretation None      MDM   Final diagnoses:  Alcohol intoxication, uncomplicated  Drug overdose, undetermined intent, initial encounter    29 year old male with alcohol abuse and benzodiazepine overdose. Admits to depression", kill everyone." He needs to be observed clinically sober and intent of his overdose better assessed. TTS eval.    Raeford RazorStephen Ellsworth Waldschmidt, MD 12/30/14 [phone]

## 2014-12-30 NOTE — BH Assessment (Signed)
[facility] Assessment Progress Note  This clinician to see pt via tele assessment.  Called and scheduled tele assessment appt, as pt now awake and can be assessed per [facility] staff.  Kristen Rulon Abdalla, MS, [facility] Therapeutic TriageCasimer Lanius Specialist [facility]

## 2014-12-31 ENCOUNTER — Encounter (HOSPITAL_COMMUNITY): Payer: Self-pay | Admitting: Psychiatry

## 2014-12-31 DIAGNOSIS — F10129 Alcohol abuse with intoxication, unspecified: Secondary | ICD-10-CM | POA: Diagnosis present

## 2014-12-31 MED ORDER — ADULT MULTIVITAMIN W/MINERALS CH: 1.0000 | ORAL_TABLET | Freq: Every day | ORAL | Status: DC

## 2014-12-31 NOTE — Progress Notes (Signed)
  [facility] Discharge Plan :  Will you be returning to the same living situation after discharge:  Yes,  with significant other At discharge, do you have transportation home?: Yes,  with friend Do you have the ability to pay for your medications: Yes,  with insurance  Release of information consent forms completed and in the chart;  Patient's signature needed at discharge.  Patient to Follow up at: Follow-up Information    Follow up with [facility]. Go in 5 days.   Why:  Walk in Clinic to access services   Contact information:   [facility] [address] [place], [place] [facility] [phone]      Patient denies SI/HI: Yes,  denies both    Safety Planning and Suicide Prevention discussed: Yes,  with patient  Have you used any form of tobacco in the last 30 days? (Cigarettes, Smokeless Tobacco, Cigars, and/or Pipes): Yes  Has patient been referred to the Quitline?: Patient refused referral  Clide DalesHarrill, Catherine Campbell 12/31/2014, 4:19 PM

## 2014-12-31 NOTE — Progress Notes (Signed)
Patient ID: Darren Griffin, male   DOB: 1985-11-08, 29 y.o.   MRN: [id number] Patient discharged per physician order; patient denies SI/HI and A/V hallucinations; patient received copy of AVS and mobile crisis information. Patient did not have any prescriptions or samples. Patient had no other questions or concerns at this time. Patient verbalized and signed that he received all belongings; Patient left the unit ambulatory

## 2014-12-31 NOTE — [facility] Suicide Risk Assessment (Signed)
[facility] INPATIENT:  Family/Significant Other Suicide Prevention Education  Suicide Prevention Education:  Patient Refusal for Family/Significant Other Suicide Prevention Education: The patient Darren Griffin has refused to provide written consent for family/significant other to be provided Family/Significant Other Suicide Prevention Education during admission and/or prior to discharge.  Physician notified. Writer provided suicide prevention education directly to patient; conversation included risk factors, warning signs and resources to contact for help. Mobile crisis services explained and  explanations given as to resources which will be included on patient's discharge paperwork.   Clide DalesHarrill, Catherine Campbell 12/31/2014, 4:21 PM
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
## 2014-12-31 NOTE — [facility] Group Notes (Signed)
   [facility] LCSW Group Therapy Note  12/31/2014 10:00 AM  Type of Therapy and Topic:  Group Therapy: Avoiding Self-Sabotaging and Enabling Behaviors  Participation Level:  Did Not Attend despite urgings of MHT and CSW   Carney Bernatherine C Harrill, LCSW

## 2014-12-31 NOTE — [facility] Counselor (Signed)
Adult Comprehensive Assessment  Patient ID: Darren Griffin, male   DOB: [birth year], 29 y.o.   MRN: [id number]  Information Source: Patient    Current Stressors:  Educational / Learning stressors: NA Employment / Job issues: NA Family Relationships: Some strain with significant other Surveyor, quantityinancial / Lack of resources (include bankruptcy): Some strain due to being only earner in household Housing / Lack of housing: NA Physical health (include injuries & life threatening diseases): NA Social relationships: NA Substance abuse: Pt endorsees need to decrease alcohol use Bereavement / Loss: Miscarriage with SO 2014 and Cousin 2013  Living/Environment/Situation:  Living Arrangements: Spouse/significant other Living conditions (as described by patient or guardian): "fine" How long has patient lived in current situation?: 5 years What is atmosphere in current home: Comfortable  Family History:  Marital status: Long term relationship Long term relationship, how long?: 5 years What types of issues is patient dealing with in the relationship?: Financial stressors and significant other not working Additional relationship information: NA Does patient have children?: No  Childhood History:  By whom was/is the patient raised?:  (Patient refuses to answer questions regarding childhood as he is anxious for DC)  Education:  Highest grade of school patient has completed: 12 Currently a student?: No Learning disability?: No  Employment/Work Situation:   Employment situation: Employed Where is patient currently employed?: [employer] [place] How long has patient been employed?: 2 years Patient's job has been impacted by current illness: No Has patient ever been in the [employer] and Companymilitary?: No Has patient ever served in Buyer, retailcombat?: No  Financial Resources:   Surveyor, quantityinancial resources: Income from employment Does patient have a representative payee or guardian?: No  Alcohol/Substance Abuse:   What has been your use of  drugs/alcohol within the last 12 months?: 3-6 beers daily; THC and Cocaine occasionally   Social Support System:   Patient's Community Support System: Good Describe Community Support System: friends and family Type of faith/religion: Ephri[employer] KnucklesChristian How does patient's faith help to cope with current illness?: Hope  Leisure/Recreation:   Leisure and Hobbies: "Lots of things"  Strengths/Needs:   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 In what areas does patient struggle / problems for patient: Stress  Discharge Plan:   Does patient have access to transportation?: Yes Will patient be returning to same living situation after discharge?: Yes Currently receiving community mental health services: No If no, would patient like referral for services when discharged?: Yes (What county?) [place] Does patient have financial barriers related to discharge medications?: No  Summary/Recommendations:   Summary and Recommendations (to be completed by the evaluator): Patient is 29 YO single employed [place] male admitted with diagnosis of Major Depressive Disorder, Severe and Alcohol Use Disorder Severe.  Patient would benefit from crisis stabilization, medication evaluation, therapy groups for processing thoughts/feelings/experiences, psycho ed groups for increasing coping skills, and aftercare planning. Discharge Process and Patient Expectations information sheet signed by patient, witnessed by writer and inserted in patient's shadow chart. [facility]  Clide DalesHarrill, Jefferey Lippmann Campbell. 12/31/2014

## 2014-12-31 NOTE — Plan of Care (Signed)
Problem: Alteration in mood Goal: LTG-Patient reports reduction in suicidal thoughts (Patient reports reduction in suicidal thoughts and is able to verbalize a safety plan for whenever patient is feeling suicidal)  Outcome: Progressing Pt denied SI to writer this shift.  He verbally contracted for safety.

## 2014-12-31 NOTE — [facility] Counselor (Signed)
Attempted to meet with patient following group on [address] which he did not attend. Patient mumbled "Later please" somewhat irritably upon introduction of PSA agenda. Will attempt again later today. Carney Bernatherine C Shiela Bruns, LCSW

## 2014-12-31 NOTE — Progress Notes (Signed)
D: Pt was pacing in hallway near entrance to unit upon approach.  He was visibly irritable and anxious.  Pt stated "I need something to calm me down.  I'm going to be pacing all night.  I'm leaving tonight."  Pt was watching to see when someone was going to come onto the unit.  Pt's visitor arrived late and pt was allowed to meet with her per charge nurse.  Pt denies SI/HI, denies hallucinations, denies pain, denies withdrawal symptoms.  Pt did not attend evening group; he had a visitor at the time. A: Introduced self to pt.  Met with pt 1:1 and provided support and encouragement.  Actively listened to pt.  Redirected pt as needed.  On-call provider notified of pt's increasing agitation and anxiety.  Ativan 94m POX1 ordered and administered.  Medications administered per order.  PRN medication administered for sleep. R: Pt is compliant with medications.  He de-escalated after administration of Ativan 169mPOX1 and after meeting with his visitor.  Pt verbally contracts for safety.  He was more open to actively participating in treatment later in the night, stating "I'm gonna give it a shot."  Will continue to monitor and assess.

## 2014-12-31 NOTE — [facility] Suicide Risk Assessment (Signed)
[facility]   Demographic Factors:  Male  Total Time spent with patient: 45 minutes  Musculoskeletal: Strength & Muscle Tone: within normal limits Gait & Station: normal Patient leans: normal  Psychiatric Specialty Exam: Physical Exam  Review of Systems  Constitutional: Negative.   HENT: Negative.   Eyes: Negative.   Respiratory: Negative.   Cardiovascular: Negative.   Gastrointestinal: Negative.   Genitourinary: Negative.   Musculoskeletal: Negative.   Skin: Negative.   Neurological: Negative.   Endo/Heme/Allergies: Negative.   Psychiatric/Behavioral: Positive for substance abuse. The patient is nervous/anxious.     Blood pressure 129/85, pulse 94, temperature 97.7 F (36.5 C), temperature source Oral, resp. rate 16, height 5\' 8"  (1.727 m), weight 75.297 kg (166 lb).Body mass index is 25.25 kg/(m^2).  General Appearance: Fairly Groomed  Patent attorneyye Contact::  Fair  Speech:  Clear and Coherent409  Volume:  Normal  Mood:  Euthymic  Affect:  Appropriate  Thought Process:  Coherent and Goal Directed  Orientation:  Full (Time, Place, and Person)  Thought Content:  plans as he moves on, relapsep prevention plan  Suicidal Thoughts:  No  Homicidal Thoughts:  No  Memory:  Immediate;   Fair Recent;   Fair Remote;   Fair  Judgement:  Fair  Insight:  Present  Psychomotor Activity:  Normal  Concentration:  Fair  Recall:  FiservFair  Fund of Knowledge:Fair  Language: Fair  Akathisia:  No  Handed:  Right  AIMS (if indicated):     Assets:  Desire for Improvement Housing Social Support Vocational/Educational  Sleep:     Cognition: WNL  ADL's:  Intact   Have you used any form of tobacco in the last 30 days? (Cigarettes, Smokeless Tobacco, Cigars, and/or Pipes): Yes  Has this patient used any form of tobacco in the last 30 days? (Cigarettes, Smokeless Tobacco, Cigars, and/or Pipes) Yes, A prescription for an FDA-approved tobacco cessation medication was  offered at discharge and the patient refused  Mental Status Per Nursing Assessment::   On Admission:  Self-harm behaviors  Current Mental Status by Physician: In full contact with reality. There are no active S/S of withdrawal. There are no active SI plans or intent. He is willing and motivated to pursue outpatient treatment. He plans to go back to work Monday as it is a holiday and he gets double pay. He is considering to quit his marijuana use. He is committed to quit his drinking   Loss Factors: NA  Historical Factors: NA  Risk Reduction Factors:   Responsible for children under 31 years of age, Sense of responsibility to family, Employed, Living with another person, especially a relative and Positive social support  Continued Clinical Symptoms:  Alcohol/Substance Abuse/Dependencies  Cognitive Features That Contribute To Risk:  Closed-mindedness, Polarized thinking and Thought constriction (tunnel vision)    Suicide Risk:  Minimal: No identifiable suicidal ideation.  Patients presenting with no risk factors but with morbid ruminations; may be classified as minimal risk based on the severity of the depressive symptoms  Principal Problem: Alcohol abuse with intoxication Discharge Diagnoses:  Patient Active Problem List   Diagnosis Date Noted  . Alcohol abuse with intoxication [F10.129] 12/31/2014  . ETOH abuse [F10.10] 12/30/2014      Plan Of Care/Follow-up recommendations:  Activity:  as tolerated Diet:  [facility]  Is patient on multiple antipsychotic therapies at discharge:  No   Has Patient had three or more failed trials of antipsychotic monotherapy by history:  No  Recommended Plan for Multiple Antipsychotic Therapies: NA    Charmaine Placido A 12/31/2014, 3:48 PM

## 2015-01-02 DIAGNOSIS — F10129 Alcohol abuse with intoxication, unspecified: Principal | ICD-10-CM
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
## 2015-01-05 NOTE — Clinical Social Work Note (Signed)
Patient has care coordinator - Lindon RompKim O'Neill [phone]  Santa GeneraAnne Sebastyan Snodgrass, LCSW Clinical Social Worker

## 2015-01-24 ENCOUNTER — Encounter (HOSPITAL_COMMUNITY): Payer: Self-pay | Admitting: Behavioral Health

## 2015-01-24 ENCOUNTER — Emergency Department (HOSPITAL_COMMUNITY)
Admission: EM | Admit: 2015-01-24 | Discharge: 2015-01-24 | Disposition: A | Discharge status: Other Acute Inpatient Hospital | Payer: Self-pay | Attending: Emergency Medicine | Admitting: Emergency Medicine

## 2015-01-24 ENCOUNTER — Inpatient Hospital Stay (HOSPITAL_COMMUNITY)
Admission: AD | Admit: 2015-01-24 | Discharge: 2015-01-27 | DRG: 897 | Disposition: A | Discharge status: Home / Self Care | Payer: No Typology Code available for payment source | Source: Intra-hospital | Attending: Psychiatry | Admitting: Psychiatry

## 2015-01-24 ENCOUNTER — Encounter (HOSPITAL_COMMUNITY): Payer: Self-pay | Admitting: Emergency Medicine

## 2015-01-24 DIAGNOSIS — R45850 Homicidal ideations: Secondary | ICD-10-CM

## 2015-01-24 DIAGNOSIS — F1024 Alcohol dependence with alcohol-induced mood disorder: Secondary | ICD-10-CM

## 2015-01-24 DIAGNOSIS — F17210 Nicotine dependence, cigarettes, uncomplicated: Secondary | ICD-10-CM | POA: Diagnosis present

## 2015-01-24 DIAGNOSIS — F10239 Alcohol dependence with withdrawal, unspecified: Secondary | ICD-10-CM | POA: Diagnosis present

## 2015-01-24 DIAGNOSIS — Z79899 Other long term (current) drug therapy: Secondary | ICD-10-CM | POA: Insufficient documentation

## 2015-01-24 DIAGNOSIS — F1010 Alcohol abuse, uncomplicated: Secondary | ICD-10-CM | POA: Insufficient documentation

## 2015-01-24 DIAGNOSIS — Z720 Tobacco use: Secondary | ICD-10-CM | POA: Insufficient documentation

## 2015-01-24 DIAGNOSIS — R45851 Suicidal ideations: Secondary | ICD-10-CM

## 2015-01-24 DIAGNOSIS — F332 Major depressive disorder, recurrent severe without psychotic features: Secondary | ICD-10-CM | POA: Diagnosis not present

## 2015-01-24 DIAGNOSIS — G4700 Insomnia, unspecified: Secondary | ICD-10-CM | POA: Diagnosis present

## 2015-01-24 DIAGNOSIS — J45909 Unspecified asthma, uncomplicated: Secondary | ICD-10-CM | POA: Insufficient documentation

## 2015-01-24 LAB — COMPREHENSIVE METABOLIC PANEL
ALBUMIN: 4.7 g/dL (ref 3.5–5.0)
ALK PHOS: 80 U/L (ref 38–126)
ALT: 31 U/L (ref 17–63)
AST: 31 U/L (ref 15–41)
Anion gap: 11 (ref 5–15)
BILIRUBIN TOTAL: 0.6 mg/dL (ref 0.3–1.2)
BUN: 8 mg/dL (ref 6–20)
CHLORIDE: 108 mmol/L (ref 101–111)
CO2: 20 mmol/L — ABNORMAL LOW (ref 22–32)
Calcium: 9.1 mg/dL (ref 8.9–10.3)
Creatinine, Ser: 1.02 mg/dL (ref 0.61–1.24)
GFR calc Af Amer: >60 mL/min (ref >60)
GLUCOSE: 88 mg/dL (ref 65–99)
Potassium: 3.5 mmol/L (ref 3.5–5.1)
Sodium: 139 mmol/L (ref 135–145)
TOTAL PROTEIN: 7.9 g/dL (ref 6.5–8.1)

## 2015-01-24 LAB — CBC WITH DIFFERENTIAL/PLATELET
Basophils Absolute: 0 10*3/uL (ref 0.0–0.1)
Basophils Relative: 0 % (ref 0–1)
Eosinophils Absolute: 0.3 10*3/uL (ref 0.0–0.7)
Eosinophils Relative: 4 % (ref 0–5)
HCT: 45.2 % (ref 39.0–52.0)
Hemoglobin: 15.3 g/dL (ref 13.0–17.0)
Lymphocytes Relative: 32 % (ref 12–46)
Lymphs Abs: 2.5 10*3/uL (ref 0.7–4.0)
MCH: 31.7 pg (ref 26.0–34.0)
MCHC: 33.8 g/dL (ref 30.0–36.0)
MCV: 93.8 fL (ref 78.0–100.0)
Monocytes Absolute: 0.6 10*3/uL (ref 0.1–1.0)
Monocytes Relative: 8 % (ref 3–12)
NEUTROS PCT: 56 % (ref 43–77)
Neutro Abs: 4.3 10*3/uL (ref 1.7–7.7)
Platelets: 221 10*3/uL (ref 150–400)
RBC: 4.82 MIL/uL (ref 4.22–5.81)
RDW: 12.9 % (ref 11.5–15.5)
WBC: 7.7 10*3/uL (ref 4.0–10.5)

## 2015-01-24 LAB — SALICYLATE LEVEL

## 2015-01-24 LAB — ACETAMINOPHEN LEVEL

## 2015-01-24 LAB — ETHANOL: ALCOHOL ETHYL (B): 195 mg/dL — AB (ref <5)

## 2015-01-24 MED ORDER — VITAMIN B-1 100 MG PO TABS: 100.0000 mg | ORAL_TABLET | Freq: Every day | ORAL | Status: DC

## 2015-01-24 MED ORDER — CHLORDIAZEPOXIDE HCL 25 MG PO CAPS
25.0000 mg | ORAL_CAPSULE | ORAL | Status: AC
  Administered 2015-01-26 – 2015-01-27 (×2): 25 mg via ORAL
  Filled 2015-01-24 (×2): qty 1

## 2015-01-24 MED ORDER — ALUM & MAG HYDROXIDE-SIMETH 200-200-20 MG/5ML PO SUSP
30.0000 mL | ORAL | Status: DC | PRN
Start: 2015-01-24 — End: 2015-01-27

## 2015-01-24 MED ORDER — VITAMIN B-1 100 MG PO TABS
100.0000 mg | ORAL_TABLET | Freq: Every day | ORAL | Status: DC
  Administered 2015-01-25 – 2015-01-27 (×3): 100 mg via ORAL
  Filled 2015-01-24 (×5): qty 1

## 2015-01-24 MED ORDER — CHLORDIAZEPOXIDE HCL 25 MG PO CAPS
25.0000 mg | ORAL_CAPSULE | Freq: Four times a day (QID) | ORAL | Status: AC
  Administered 2015-01-24 – 2015-01-25 (×4): 25 mg via ORAL
  Filled 2015-01-24 (×4): qty 1

## 2015-01-24 MED ORDER — ONDANSETRON HCL 4 MG PO TABS: 4.0000 mg | ORAL_TABLET | Freq: Three times a day (TID) | ORAL | Status: DC | PRN

## 2015-01-24 MED ORDER — LORAZEPAM 1 MG PO TABS: 0.0000 mg | ORAL_TABLET | Freq: Four times a day (QID) | ORAL | Status: DC

## 2015-01-24 MED ORDER — NICOTINE POLACRILEX 2 MG MT GUM: 2.0000 mg | CHEWING_GUM | OROMUCOSAL | Status: DC | PRN

## 2015-01-24 MED ORDER — CHLORDIAZEPOXIDE HCL 25 MG PO CAPS: 25.0000 mg | ORAL_CAPSULE | Freq: Four times a day (QID) | ORAL | Status: AC | PRN

## 2015-01-24 MED ORDER — ADULT MULTIVITAMIN W/MINERALS CH
1.0000 | ORAL_TABLET | Freq: Every day | ORAL | Status: DC
  Administered 2015-01-24 – 2015-01-27 (×4): 1 via ORAL
  Filled 2015-01-24 (×3): qty 1
  Filled 2015-01-24: qty 14
  Filled 2015-01-24: qty 1
  Filled 2015-01-24: qty 14
  Filled 2015-01-24: qty 1

## 2015-01-24 MED ORDER — ONDANSETRON 4 MG PO TBDP: 4.0000 mg | ORAL_TABLET | Freq: Four times a day (QID) | ORAL | Status: AC | PRN

## 2015-01-24 MED ORDER — MAGNESIUM HYDROXIDE 400 MG/5ML PO SUSP: 30.0000 mL | Freq: Every day | ORAL | Status: DC | PRN

## 2015-01-24 MED ORDER — TRAZODONE HCL 50 MG PO TABS
50.0000 mg | ORAL_TABLET | Freq: Every evening | ORAL | Status: DC | PRN
  Administered 2015-01-24 – 2015-01-25 (×2): 50 mg via ORAL
  Filled 2015-01-24 (×2): qty 1

## 2015-01-24 MED ORDER — ACETAMINOPHEN 325 MG PO TABS
650.0000 mg | ORAL_TABLET | Freq: Four times a day (QID) | ORAL | Status: DC | PRN
  Administered 2015-01-24: 650 mg via ORAL
  Filled 2015-01-24: qty 2

## 2015-01-24 MED ORDER — LORAZEPAM 1 MG PO TABS: 0.0000 mg | ORAL_TABLET | Freq: Two times a day (BID) | ORAL | Status: DC

## 2015-01-24 MED ORDER — HYDROCORTISONE 0.5 % EX OINT
TOPICAL_OINTMENT | Freq: Three times a day (TID) | CUTANEOUS | Status: DC | PRN
  Administered 2015-01-24: 19:00:00 via TOPICAL
  Administered 2015-01-25: 1 via TOPICAL
  Administered 2015-01-25: 21:00:00 via TOPICAL
  Filled 2015-01-24: qty 28.35

## 2015-01-24 MED ORDER — CHLORDIAZEPOXIDE HCL 25 MG PO CAPS: 25.0000 mg | ORAL_CAPSULE | Freq: Every day | ORAL | Status: DC

## 2015-01-24 MED ORDER — IBUPROFEN 400 MG PO TABS: 600.0000 mg | ORAL_TABLET | Freq: Three times a day (TID) | ORAL | Status: DC | PRN

## 2015-01-24 MED ORDER — LORATADINE 10 MG PO TABS
10.0000 mg | ORAL_TABLET | Freq: Every day | ORAL | Status: DC
  Administered 2015-01-24 – 2015-01-27 (×4): 10 mg via ORAL
  Filled 2015-01-24: qty 1
  Filled 2015-01-24: qty 14
  Filled 2015-01-24 (×5): qty 1

## 2015-01-24 MED ORDER — CHLORDIAZEPOXIDE HCL 25 MG PO CAPS
25.0000 mg | ORAL_CAPSULE | Freq: Three times a day (TID) | ORAL | Status: AC
  Administered 2015-01-25 – 2015-01-26 (×3): 25 mg via ORAL
  Filled 2015-01-24 (×3): qty 1

## 2015-01-24 MED ORDER — HYDROXYZINE HCL 25 MG PO TABS
25.0000 mg | ORAL_TABLET | Freq: Four times a day (QID) | ORAL | Status: AC | PRN
  Administered 2015-01-25: 25 mg via ORAL
  Filled 2015-01-24: qty 1

## 2015-01-24 MED ORDER — ALBUTEROL SULFATE HFA 108 (90 BASE) MCG/ACT IN AERS: 2.0000 | INHALATION_SPRAY | Freq: Four times a day (QID) | RESPIRATORY_TRACT | Status: DC | PRN

## 2015-01-24 MED ORDER — THIAMINE HCL 100 MG/ML IJ SOLN
100.0000 mg | Freq: Once | INTRAMUSCULAR | Status: AC
  Administered 2015-01-24: 100 mg via INTRAMUSCULAR
  Filled 2015-01-24: qty 2

## 2015-01-24 MED ORDER — FLUOXETINE HCL 10 MG PO CAPS
10.0000 mg | ORAL_CAPSULE | Freq: Every day | ORAL | Status: DC
  Administered 2015-01-24 – 2015-01-25 (×2): 10 mg via ORAL
  Filled 2015-01-24 (×4): qty 1

## 2015-01-24 MED ORDER — LOPERAMIDE HCL 2 MG PO CAPS: 2.0000 mg | ORAL_CAPSULE | ORAL | Status: AC | PRN

## 2015-01-24 NOTE — ED Notes (Signed)
[facility] here for transport, patient denies any locked up belongings.

## 2015-01-24 NOTE — ED Notes (Signed)
Called [facility] for transport to [facility]. 

## 2015-01-24 NOTE — ED Notes (Signed)
Patients tray placed at bedside at this time.

## 2015-01-24 NOTE — BH Assessment (Addendum)
Tele Assessment Note   Darren Griffin is an 29 y.o.single  male who was brought to the [facility] by [facility] tonight after a call from his mother.  Information for this assessment was obtained from pt and Epic records. No collateral information was available. Pt reported that he had been drinking heavily today and had been drinking heavily every day for the last year. Pt reported that he was continually depressed due to killing his cousin accidentally in 2013 and servinf 2 years in prison. Pt reported that he does have SI stating "I don't care anymore." Pt sts he has planned to kill himself 2 times in the last 6 months by OD of Xanax.  Pt reports that he takes 4 Xanax a day (he has no prescription for Xanax.) Also, pt sts he may hurt others when intoxicated because he sts he "will hurt anyone who gets in my way." Pt sts he "blanks out" and does not know what he does when this happens. Pt reported that he has been arrested 3 times for assault since leaving prison. Pt was IP at [facility] on 12/30/2014 and pt sts he left too soon.  Pt denies HI, SHI and AH.  Pt sts that he sometimes "sses things" others do not.  Pt reported seeing a woman next to his GF recently who he knows was not there.  Pt denies physical, emotional/verbal and sexual abuse. Pt reports  He sleeps on average 2 hours per night and has neither lost or gained weight in the last few months.  Pt reports that recently he has lost his appetite due to alcohol consumption. Pt reports that he consumes 2-24 packs of beer daily.  Pt also smokes 2 packs of cigarettes daily.  Pt sts he is an occaional marijuana user. Pt reports that he has pending charges against him with an upcoming court date in August but, would not give any details on what the charges were. Pt states he is not on probation. Pt's ETOH tested 195 tonight. UDS is incomplete at this time.  Pt sts he lives with his GF. 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  Pt sts he has been prescribed MH medication but can't remember who prescribed them for him and sts he is not taking the medication. Pt sts he has had OPT at [facility] at one tine but no longer goes for therapy.   Pt was quiet but awake, cooperative and pleasant during the assessment.  He was dressed in street clothes and sitting in an ED chair. Pt kept his eyes closed during most of the assessment due to sleepiness, not rudeness.  Pt made natural gestures as he talked and spoke in a slow, slurred voice. Pt's thought processes were coherent and relevant but his judgement was clearly impaired. Pt's mood was depressed and his blunted affect was congruent. Pt was oriented x 4.   Axis I:303.90 Alcohol Use Disorder, severe; 311 Unspecified Depressive Disorder Axis II: Deferred Axis III:  Past Medical History  Diagnosis Date  . Asthma    Axis IV: economic problems, housing problems, occupational problems, other psychosocial or environmental problems, problems related to legal system/crime, problems related to social environment, problems with access to health care services and problems with primary support group Axis V: 11-20 some danger of hurting self or others possible OR occasionally fails to maintain minimal personal hygiene OR  gross impairment in communication  Past Medical History:  Past Medical History  Diagnosis Date  . Asthma     History reviewed. No pertinent past surgical history.  Family History: History reviewed. No pertinent family history.  Social History:  reports that he has been smoking Cigarettes.  He has a 20 pack-year smoking history. He does not have any smokeless tobacco history on file. He reports that he drinks alcohol. He reports that he uses illicit drugs (Marijuana).  Additional Social History:  Alcohol / Drug Use Prescriptions: See PTA list History of alcohol / drug use?: Yes Longest period of sobriety (when/how  long): 2 years while in prison Substance #1 Name of Substance 1: Alcohol 1 - Age of First Use: 13 1 - Amount (size/oz): 2-24 packs of beer 1 - Frequency: daily 1 - Duration: 1 year 1 - Last Use / Amount: today Substance #2 Name of Substance 2: Xanax 2 - Age of First Use: 27 2 - Amount (size/oz): "don't know...maybe 4 a day" 2 - Frequency: daily 2 - Duration: 1 year 2 - Last Use / Amount: today Substance #3 Name of Substance 3: Nicotine 3 - Age of First Use: 13 3 - Amount (size/oz): 2 packs 3 - Frequency: daily 3 - Duration: "long time" 3 - Last Use / Amount: today Substance #4 Name of Substance 4: Marijuana 4 - Age of First Use: 13 4 - Amount (size/oz): "don't know" 4 - Frequency: "occasionally...maybe 2 x month" 4 - Duration: "long time" 4 - Last Use / Amount: "don't know"  CIWA: CIWA-Ar BP: 111/73 mmHg Pulse Rate: 100 Nausea and Vomiting: no nausea and no vomiting Tactile Disturbances: none Tremor: no tremor Auditory Disturbances: not present Paroxysmal Sweats: no sweat visible Visual Disturbances: not present Anxiety: no anxiety, at ease Headache, Fullness in Head: none present Agitation: normal activity Orientation and Clouding of Sensorium: oriented and can do serial additions CIWA-Ar Total: 0 COWS:    PATIENT STRENGTHS: (choose at least two) Average or above average intelligence Communication skills Supportive family/friends  Allergies: No Known Allergies  Home Medications:  (Not in a hospital admission)  OB/GYN Status:  No LMP for male patient.  General Assessment Data [facility] of Assessment: [facility] TTS Assessment: In system Is this a Tele or Face-to-Face Assessment?: Tele Assessment Is this an Initial Assessment or a Re-assessment for this encounter?: Initial Assessment Marital status: Single Maiden name: na Is patient pregnant?: No Pregnancy Status: No Living Arrangements: Spouse/significant other (w/ GF) Can pt return to current living  arrangement?: Yes Admission Status: Voluntary Is patient capable of signing voluntary admission?: Yes Referral Source: Self/Family/Friend Insurance type: None  Medical Screening Exam [facility] Walk-in ONLY) Medical Exam completed: No Reason for MSE not completed: Other: (labs incomplete)  Crisis Care Plan Living Arrangements: Spouse/significant other (w/ GF) Name of Psychiatrist: none Name of Therapist: none  Education Status Is patient currently in school?: No Current Grade: na Highest grade of school patient has completed: 9 Name of school: na Contact person: na  Risk to self with the past 6 months Suicidal Ideation: No (denies) Has patient been a risk to self within the past 6 months prior to admission? : Yes Suicidal Intent: No (denies) Has patient had any suicidal intent within the past 6 months prior to admission? : Yes Is patient at risk for suicide?: Yes Suicidal Plan?: No (denies) Has patient had any suicidal plan within the past 6 months prior to admission? : Yes Specify Current Suicidal Plan: OD on Xanax  Access to Means: Yes Specify Access to Suicidal Means: street drugs What has been your use of drugs/alcohol within the last 12 months?: daily use Previous Attempts/Gestures: Yes How many times?: 3 Other Self Harm Risks: none noted Triggers for Past Attempts: Unpredictable Intentional Self Injurious Behavior: None Family Suicide History: No Recent stressful life event(s): Other (Comment) (Depression over killing his cousin accidentally & prison) Persecutory voices/beliefs?: No Depression: Yes Depression Symptoms: Insomnia, Tearfulness, Isolating, Fatigue, Guilt, Loss of interest in usual pleasures, Feeling worthless/self pity, Feeling angry/irritable Substance abuse history and/or treatment for substance abuse?: Yes Suicide prevention information given to non-admitted patients: Not applicable  Risk to Others within the past 6 months Homicidal Ideation:  Yes-Currently Present (sts he thinks he might hurt someone when drinking) Does patient have any lifetime risk of violence toward others beyond the six months prior to admission? : Yes (comment) Thoughts of Harm to Others: Yes-Currently Present (sts he thinks he might hurt someone else when drunk) Comment - Thoughts of Harm to Others: sts "I will hurt anyone who gets in my way when drunk" Current Homicidal Intent: No (denies) Current Homicidal Plan: No Access to Homicidal Means: No (denies) Identified Victim: na History of harm to others?: Yes Assessment of Violence: In distant past Violent Behavior Description: sts he killed his cousin accidentally (sts he may hurt others when drunk) Does patient have access to weapons?: No (denies) Criminal Charges Pending?: Yes Describe Pending Criminal Charges: pt will not give details Does patient have a court date: Yes Court Date:  (Aug 2016) Is patient on probation?: No (denies)  Psychosis Hallucinations: Visual (sts he sees people not there) Delusions: None noted  Mental Status Report Appearance/Hygiene: Disheveled, Unremarkable Eye Contact: Poor (eyes closed most of the time) Motor Activity: Gestures, Unremarkable Speech: Logical/coherent, Slow, Slurred Level of Consciousness: Quiet/awake, Drowsy Mood: Depressed, Irritable, Ashamed/humiliated, Pleasant Affect: Appropriate to circumstance, Irritable, Depressed Anxiety Level: Minimal Thought Processes: Coherent, Relevant Judgement: Impaired Orientation: Person, Place, Time, Situation Obsessive Compulsive Thoughts/Behaviors: None  Cognitive Functioning Concentration: Poor Memory: Recent Intact, Remote Intact IQ: Average Insight: Poor Impulse Control: Poor Appetite: Fair Weight Loss: 0 Weight Gain: 0 Sleep: Decreased Total Hours of Sleep: 2 Vegetative Symptoms: None  ADLScreening [facility] Assessment Services) Patient's cognitive ability adequate to safely complete daily activities?:  Yes Patient able to express need for assistance with ADLs?: Yes Independently performs ADLs?: Yes (appropriate for developmental age)  Prior Inpatient Therapy Prior Inpatient Therapy: Yes Prior Therapy Dates: 12/30/2014 Prior Therapy Facilty/Provider(s): [facility] Reason for Treatment: Alcohol Intoxication, SI  Prior Outpatient Therapy Prior Outpatient Therapy: Yes Prior Therapy Dates: unknown dates per pt Prior Therapy Facilty/Provider(s): [facility] Reason for Treatment: OPT & med mgmt Does patient have an ACCT team?: No Does patient have Intensive In-House Services?  : No Does patient have [facility] services? : No Does patient have P4CC services?: No  ADL Screening (condition at time of admission) Patient's cognitive ability adequate to safely complete daily activities?: Yes Patient able to express need for assistance with ADLs?: Yes Independently performs ADLs?: Yes (appropriate for developmental age)       Abuse/Neglect Assessment (Assessment to be complete while patient is alone) Physical Abuse: Denies Verbal Abuse: Denies Sexual Abuse: Denies Exploitation of patient/patient's resources: Denies Self-Neglect: Denies     Merchant [employer] officer (For Healthcare) Does patient have an advance directive?: No Would patient like information on creating an advanced directive?: No - patient declined information    Additional Information 1:1 In Past 12 Months?: No CIRT Risk: No  Elopement Risk: No Does patient have medical clearance?: No     Disposition:  Disposition Initial Assessment Completed for this Encounter: Yes Disposition of Patient: Other dispositions (Pending review w [facility] Ext[facility]) Type of inpatient treatment program: Adult Other disposition(s): Other (Comment)  Per Nanine Means, NP: Meets IP criteria.  400 hall recommended.  Per Clint Bolder, AC:  Accepted to [facility] Bed 400-1.  Accepting Dr. Jama Flavors. Can come after 7 A 01/24/15  Spoke to Dr. Elesa Massed at [facility]: Advised of  recommendation.  She agreed.  Beryle Flock, MS, CRC, [facility] Triage Specialist [facility] T 01/24/2015 4:23 AM
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
## 2015-01-24 NOTE — Progress Notes (Signed)
Patient has denied SI and HI several times today, contracts for safety.   Denied auditory hallucinations.  Stated he does see shadows at time.  Patient has been cooperative and pleasant.

## 2015-01-24 NOTE — Progress Notes (Signed)
[facility] Group Notes:  (Nursing/MHT/Case Management/Adjunct)  Date:  01/24/2015  Time:  9:14 PM  Type of Therapy:  Psychoeducational Skills  Participation Level:  Active  Participation Quality:  Appropriate  Affect:  Appropriate  Cognitive:  Appropriate  Insight:  Good  Engagement in Group:  Engaged  Modes of Intervention:  Discussion  Summary of Progress/Problems: Tonight in wrap up group Darren Griffin said that today was a 7 for him. He was happy he was able to speak with his kids, he stated its been about 2 mos and today was the first time since then.  Madaline Savage 01/24/2015, 9:14 PM
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
## 2015-01-24 NOTE — ED Notes (Signed)
[facility] called and report given. [facility] called for transportation.

## 2015-01-24 NOTE — Progress Notes (Signed)
Recreation Therapy Notes  Animal-Assisted Activity (AAA) Program Checklist/Progress Notes Patient Eligibility Criteria Checklist & Daily Group note for Rec Tx Intervention  Date: 07.26.16 Time: 2:45 pm Location: [address]  AAA/T Program Assumption of Risk Form signed by Patient/ or Parent Legal Guardian yes  Patient is free of allergies or sever asthma yes  Patient reports no fear of animals yes  Patient reports no history of cruelty to animalsyes  Patient understands his/her participation is voluntary yes  Patient washes hands before animal contact yes  Patient washes hands after animal contact yes  Education: Hand Washing, Appropriate Animal Interaction   Education Outcome: Acknowledges understanding/In group clarification offered/Needs additional education.   Clinical Observations/Feedback:  Patient did not attend group.   Caroll Rancher, LRT/CTRS         Caroll Rancher A 01/24/2015 4:07 PM

## 2015-01-24 NOTE — ED Provider Notes (Addendum)
TIME SEEN: 3:30 AM  CHIEF COMPLAINT: Alcohol intoxication  HPI: Pt is a 29 y.o. male with history of asthma and alcohol abuse who presents to the emergency department intoxicated. He states that he needs help from drinking alcohol. States he's been drinking heavily for the past year. Reports that he accidentally killed his cousin and spent time in prison for this. He states that he is drinking in order to cope with his depression. He states "if you let me leave, I will just go home and take 100 pills because I don't care anymore". He states he is also concerned that he may hurt other people because he states "I just blank out". He states that he does not have anyone in particular that he wants to hurt but states he will "hurt anybody that gets in my way". Reports smoking marijuana and 2 weeks ago. No other drug use. Was recently admitted to the hospital for possible overdose and alcohol intoxication but states "I was do been and left too early". Denies any history of withdrawal seizures but states he has been drinking every day for the past year. Denies any new medical complaints.  ROS: See HPI Constitutional: no fever  Eyes: no drainage  ENT: no runny nose   Cardiovascular:  no chest pain  Resp: no SOB  GI: no vomiting GU: no dysuria Integumentary: no rash  Allergy: no hives  Musculoskeletal: no leg swelling  Neurological: no slurred speech ROS otherwise negative  PAST MEDICAL HISTORY/PAST SURGICAL HISTORY:  Past Medical History  Diagnosis Date  . Asthma     MEDICATIONS:  Prior to Admission medications   Medication Sig Start Date End Date Taking? Authorizing Provider  albuterol (PROVENTIL HFA;VENTOLIN HFA) 108 (90 BASE) MCG/ACT inhaler Inhale 2 puffs into the lungs every 6 (six) hours as needed for wheezing or shortness of breath.     Historical Provider, MD  Multiple Vitamin (MULTIVITAMIN WITH MINERALS) TABS tablet Take 1 tablet by mouth daily. 12/31/14   Thermon Leyland, NP     ALLERGIES:  No Known Allergies  SOCIAL HISTORY:  History  Substance Use Topics  . Smoking status: Current Every Day Smoker -- 2.00 packs/day for 10 years    Types: Cigarettes  . Smokeless tobacco: Not on file  . Alcohol Use: Yes     Comment: drunk now    FAMILY HISTORY: History reviewed. No pertinent family history.  EXAM: BP 111/73 mmHg  Pulse 100  Temp(Src) 98.3 F (36.8 C)  Resp 18  Ht  (1.753 m)  Wt 158 lb (71.668 kg)  BMI 23.32 kg/m2  SpO2 97% CONSTITUTIONAL: Alert and oriented and responds appropriately to questions. Patient appears intoxicated, smells of alcohol, slightly slurred speech HEAD: Normocephalic EYES: Conjunctivae clear, PERRL ENT: normal nose; no rhinorrhea; moist mucous membranes; pharynx without lesions noted NECK: Supple, no meningismus, no LAD  CARD: RRR; S1 and S2 appreciated; no murmurs, no clicks, no rubs, no gallops RESP: Normal chest excursion without splinting or tachypnea; breath sounds clear and equal bilaterally; no wheezes, no rhonchi, no rales, no hypoxia or respiratory distress, speaking full sentences ABD/GI: Normal bowel sounds; non-distended; soft, non-tender, no rebound, no guarding, no peritoneal signs BACK:  The back appears normal and is non-tender to palpation, there is no CVA tenderness EXT: Normal ROM in all joints; non-tender to palpation; no edema; normal capillary refill; no cyanosis, no calf tenderness or swelling    SKIN: Normal color for age and race; warm NEURO: Moves all extremities equally,  sensation to light touch intact diffusely, cranial nerves II through XII intact PSYCH: Depressed and flat affect. Endorses suicidal thoughts and states that he will overdose if discharged home. Reports nonspecific homicidal thoughts to "anyone that gets in my way". Denies hallucinations.  MEDICAL DECISION MAKING: Patient here requesting help for alcohol abuse. He also has suicidal thoughts with plan to overdose if discharged  home. Also endorsing homicidal thoughts. Will consult TTS. Will obtain screening labs and urine. At this time he is here voluntarily.  ED PROGRESS: Discussed with Corrie Dandy with TTS who has evaluated patient. She states the patient does meet inpatient criteria. [facility] after 7 AM. Accepting physician is Dr. Jama Flavors.   Labs unremarkable other than a bicarbonate that is slightly low at 20. Anion gap is normal. Alcohol is 195. He is currently medically cleared.     Layla Maw Ward, DO 01/24/15 1610  Layla Maw Ward, DO 01/24/15 0441  Layla Maw Ward, DO 01/24/15 [phone]

## 2015-01-24 NOTE — Progress Notes (Signed)
Admission note: Pt presents anxious and silly on admit. Pt reported that he is here for substance abuse and mild depression. Pt denies any suicidal thoughts at this time.  Pt reported depression from accidentally killing his cousin two years ago. Pt reported that he drinks alcohol daily. Pt denies illegal drugs. Pt reported seeing shadows for the last three years.   Pt v/s taken, belongings searched, skin assessed and required documents signed.

## 2015-01-24 NOTE — ED Notes (Signed)
Pt states he needs detox from alcohol. Pt states he had drank a lot tight.

## 2015-01-24 NOTE — Tx Team (Signed)
Initial Interdisciplinary Treatment Plan   PATIENT STRESSORS: Substance abuse Traumatic event   PATIENT STRENGTHS: Ability for insight Capable of independent living Motivation for treatment/growth   PROBLEM LIST: Problem List/Patient Goals Date to be addressed Date deferred Reason deferred Estimated date of resolution  "Substance abuse" 01/24/2015     "Depression" 01/24/2015                                                DISCHARGE CRITERIA:  Ability to meet basic life and health needs Adequate post-discharge living arrangements Improved stabilization in mood, thinking, and/or behavior Verbal commitment to aftercare and medication compliance Withdrawal symptoms are absent or subacute and managed without 24-hour nursing intervention  PRELIMINARY DISCHARGE PLAN: Attend aftercare/continuing care group Attend PHP/IOP  PATIENT/FAMIILY INVOLVEMENT: This treatment plan has been presented to and reviewed with the patient, Darren Griffin, and/or family member.  The patient and family have been given the opportunity to ask questions and make suggestions.  Darren Griffin L 01/24/2015, 10:44 AM

## 2015-01-24 NOTE — [facility] Counselor (Signed)
Adult Comprehensive Assessment  Patient ID: Darren Griffin, male   DOB: 05-20-1986, 29 y.o.   MRN: [id number]  Information Source: Patient    Current Stressors:  Educational / Learning stressors: NA Employment / Job issues: NA Family Relationships: Some strain with significant other, states she is pregnant and irritable; grandmother in hospital x 2 weeks Financial / Lack of resources (include bankruptcy): Some strain due to being only earner in household Housing / Lack of housing: Stable housing Physical health (include injuries & life threatening diseases): NA Social relationships: NA Substance abuse: Pt endorsees need to decrease alcohol use Bereavement / Loss: Miscarriage with SO 2014 and Cousin 2013; charged w involuntary manslaughter in death of cousin - states cousin died in his arms, cannot stop thinking about traumatic event, served 2 years in jail for this, released one year ago  Living/Environment/Situation:  Living Arrangements: Spouse/significant other Living conditions (as described by patient or guardian): "fine" How long has patient lived in current situation?: 5 years What is atmosphere in current home: Comfortable  Family History:  Marital status: Long term relationship Long term relationship, how long?: 5 years What types of issues is patient dealing with in the relationship?: Financial stressors and significant other not working, significant other pregnant, conflict re needs Additional relationship information: NA Does patient have children?: Yes - 2 girls live in [place] (14, 34) - mother restricts visitation by patient somewhat; son (7) lives in [place] - sees several times/week  Childhood History:  By whom was/is the patient raised?:  Raised by mother and grandmother in [place], had good relationship w them, "butted heads sometimes" but otherwise normal childhood  Education:  Highest grade of school patient has completed: 12 Currently a student?:  No Learning disability?: No  Employment/Work Situation:   Employment situation: Employed 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 [place] How long has patient been employed?: 2 years Patient's job has been impacted by current illness: No Has patient ever been in the [employer]?: No Has patient ever served in Buyer, retail?: No  Financial Resources:   Surveyor, quantity resources: Income from employment Does patient have a representative payee or guardian?: No  Alcohol/Substance Abuse:   What has been your use of drugs/alcohol within the last 12 months?: 3-6 beers daily; THC and Cocaine occasionally; current admission says he drinks a case of beer/day when he is stressed/depressed - sometimes "a little liquor", says he has tried to quit, has changed friends in attempt to limit drinking, has been unable to successfully quit.  Says he drinks in response to unpleasant thoughts/memories related to death of cousin   Social Support System:   Patient's Community Support System: Good Describe Community Support System: friends and family Type of faith/religion: Ephriam Knuckles How does patient's faith help to cope with current illness?: Hope  Leisure/Recreation:   Leisure and Hobbies: "Lots of things", used to like to do out door activities, including fishing - last fished 2 weeks ago.  Likes to work.  Strengths/Needs:   What things does the patient do well?: Good worker, cares for children, girlfriend, grandmother In what areas does patient struggle / problems for patient: Stress, quitting drinking  Discharge Plan:   Does patient have access to transportation?: Yes, had difficulty getting to aftercare appts because he has no car and grandmother, who does have car, was in hospital and unable t help transport Will patient be returning to same living situation after discharge?: Yes Currently receiving community mental health services: No If no, would patient like referral for  services when discharged?: Yes (What  county?) [facility]) - was referred to [facility] after last admission, did not make follow up appts or fill any prescriptions Does patient have financial barriers related to discharge medications?: No, "as long as they are not expensive  Summary/Recommendations:   Summary and Recommendations (to be completed by the evaluator): Patient is 29 YO single employed [place] male admitted with diagnosis of Major Depressive Disorder, Severe and Alcohol Use Disorder Severe.  Patient describes worsening depression x2 weeks, especially related to intrusive thoughts and rumination about traumatic experience of death of cousin - patient was charged w Psychiatrist in this death, served 2 year prison sentence, was released one year ago.  States that he drinks to stop thinking about this event.  Has talked w girlfriend and grandmother, this has been helpful, but pt still struggles w memories and self blame.  Attributes much of his current alcohol abuse and depression to this event, would consider referral for grief counseling.  Patient did not make any after care appointments or take any medications prescribed at last hospitaliztion in early July - states that he could not get to [facility] due to lack of transportation and did not fill medications due to concern about cost.  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  Has difficulty in current long term relationship w girlfriend who is currently pregnant w his child - strained relationship that is not as supportive as it used to be.    Patient will benefit from hospitalization to receive psychoeducation and group therapy services to increase coping skills for and understanding of depression, milieu therapy, medications management, and nursing support.  Patient will develop appropriate coping skills for dealing w overwhelming emotions, stabilize on medications, and develop greater  insight into and acceptance of his current illness.  CSWs will develop discharge plan to include family support and referral to appropriate after care services, patient would benefit from services related to grief counseling and/or PTSD treatment due to traumatic nature of cousin's death and patient's continued struggle to resolve issues related to this.  Santa Genera, LCSW Clinical Social Worker 01/24/2015

## 2015-01-24 NOTE — [facility] Group Notes (Signed)
[facility] LCSW Group Therapy 01/24/2015 1:15 PM  Type of Therapy: Group Therapy- Feelings about Diagnosis  Pt did not attend, declined invitation.   Griffey Nicasio Carter, LCSWA 01/24/2015 5:02 PM  

## 2015-01-25 DIAGNOSIS — F1024 Alcohol dependence with alcohol-induced mood disorder: Secondary | ICD-10-CM

## 2015-01-25 MED ORDER — ACAMPROSATE CALCIUM 333 MG PO TBEC
666.0000 mg | DELAYED_RELEASE_TABLET | Freq: Three times a day (TID) | ORAL | Status: DC
  Administered 2015-01-25 – 2015-01-27 (×7): 666 mg via ORAL
  Filled 2015-01-25: qty 2
  Filled 2015-01-25: qty 84
  Filled 2015-01-25: qty 2
  Filled 2015-01-25: qty 84
  Filled 2015-01-25 (×5): qty 2
  Filled 2015-01-25: qty 84
  Filled 2015-01-25: qty 2

## 2015-01-25 MED ORDER — FLUOXETINE HCL 20 MG PO CAPS
20.0000 mg | ORAL_CAPSULE | Freq: Every day | ORAL | Status: DC
  Administered 2015-01-26 – 2015-01-27 (×2): 20 mg via ORAL
  Filled 2015-01-25: qty 1
  Filled 2015-01-25: qty 14
  Filled 2015-01-25 (×2): qty 1

## 2015-01-25 NOTE — Progress Notes (Signed)
Recreation Therapy Notes  Date: 07.27.16 Time: 9:30 am Location: [address] Group Room  Group Topic: Stress Management  Goal Area(s) Addresses:  Patient will verbalize importance of using healthy stress management.  Patient will identify positive emotions associated with healthy stress management.   Intervention: Stress Management  Activity :  Guided Training and development officer.  LRT introduced the technique of guided imagery.  A script was used to deliver the technique to the patients.  Patients were asked to follow the script read a loud by LRT to engage in the technique of guided imagery.  Education:  Stress Management, Discharge Planning.   Education Outcome: Acknowledges edcuation/In group clarification offered/Needs additional education  Clinical Observations/Feedback: Patient did not attend group.   Caroll Rancher, LRT/CTRS         Caroll Rancher A 01/25/2015 12:21 PM

## 2015-01-25 NOTE — [facility] Group Notes (Signed)
[facility] LCSW Group Therapy 01/25/2015 1:15 PM  Type of Therapy: Group Therapy- Emotion Regulation  Participation Level: Active   Participation Quality:  Appropriate  Affect: Appropriate  Cognitive: Alert and Oriented   Insight:  Developing/Improving  Engagement in Therapy: Developing/Improving and Engaged   Modes of Intervention: Clarification, Confrontation, Discussion, Education, Exploration, Limit-setting, Orientation, Problem-solving, Rapport Building, Dance movement psychotherapist, Socialization and Support  Summary of Progress/Problems: The topic for group today was emotional regulation. This group focused on both positive and negative emotion identification and allowed group members to process ways to identify feelings, regulate negative emotions, and find healthy ways to manage internal/external emotions. Group members were asked to reflect on a time when their reaction to an emotion led to a negative outcome and explored how alternative responses using emotion regulation would have benefited them. Group members were also asked to discuss a time when emotion regulation was utilized when a negative emotion was experienced. Pt interacted well with peers and discussed strategies that have been successful for him in regulating emotions. He identified hobbies such as fishing as well as his need to talk to a trusted individual about stressful emotions. Pt encouraged other peers to use their faith if that was relevant to them in helping manage their emotions.   Chad Cordial, LCSWA 01/25/2015 4:15 PM

## 2015-01-25 NOTE — Progress Notes (Signed)
D: Pt denies SI/HI/AV. Pt is pleasant and cooperative. Pt rates depression at a 3, anxiety at a 2, and Helplessness/hopelessness at a 1.  A: Pt was offered support and encouragement. Pt was given scheduled medications. Pt was encourage to attend groups. Q 15 minute checks were done for safety.  R:Pt attends some groups and interacts well with peers and staff. Pt taking medication. Pt has no complaints.Pt receptive to treatment and safety maintained on unit.

## 2015-01-25 NOTE — [facility] Suicide Risk Assessment (Signed)
[facility] INPATIENT:  Family/Significant Other Suicide Prevention Education  Suicide Prevention Education:  Patient Refusal for Family/Significant Other Suicide Prevention Education: The patient Darren Griffin has refused to provide written consent for family/significant other to be provided Family/Significant Other Suicide Prevention Education during admission and/or prior to discharge.  Physician notified. SPE reviewed with patient and brochure provided. Patient encouraged to return to hospital if having suicidal thoughts, patient verbalized his/her understanding and has no further questions at this time.   [name] 01/25/2015, 2:39 PM

## 2015-01-25 NOTE — Progress Notes (Signed)
[facility] MD Progress Note  01/25/2015 12:39 PM Darren Griffin  MRN:  [id number] Subjective:   States he is feeling a little bit better, still depressed, and vaguely irritable, due to which he is spending time in his room. He does not report current significant symptoms of WDL. At this time his major focus is to obtain letter certifying that he is in hospital , which he states he needs to send to his lawyer today. Denies medication side effects. Objective :  I have discussed case with treatment team and have met with patient. Patient currently calm, in no acute distress, no distal tremors noted, no diaphoresis, vitals stable. No visual disturbances endorsed.  Mood partially improved but still depressed and  Somewhat dysphoric. Vaguely irritable but behavior calm and in good control. Denies medication side effects. Endorses frequent cravings for alcohol- interested in CAMPRAL trial to address- we reviewed side effect profile.  Some group participation- isolates at times . Labs unremarkable- BAL 195 on admission Principal Problem: Alcohol dependence with alcohol-induced mood disorder Diagnosis:   Patient Active Problem List   Diagnosis Date Noted  . Alcohol abuse [F10.10] 01/24/2015  . MDD (major depressive disorder), recurrent episode, severe [F33.2] 01/24/2015  . Alcohol dependence with alcohol-induced mood disorder [F10.24]   . Alcohol abuse with intoxication [F10.129] 12/31/2014  . ETOH abuse [F10.10] 12/30/2014   Total Time spent with patient: 20 minutes    Past Medical History:  Past Medical History  Diagnosis Date  . Asthma    History reviewed. No pertinent past surgical history. Family History: History reviewed. No pertinent family history. Social History:  History  Alcohol Use  . Yes    Comment: drunk now     History  Drug Use  . Yes  . Special: Marijuana    History   Social History  . Marital Status: Single    Spouse Name: N/A  . Number of Children: N/A  . Years of  Education: N/A   Social History Main Topics  . Smoking status: Current Every Day Smoker -- 2.00 packs/day for 10 years    Types: Cigarettes  . Smokeless tobacco: Not on file  . Alcohol Use: Yes     Comment: drunk now  . Drug Use: Yes    Special: Marijuana  . Sexual Activity: Yes   Other Topics Concern  . None   Social History Narrative   Additional History:    Sleep: improved   Appetite:  Good   Assessment:   Musculoskeletal: Strength & Muscle Tone: within normal limits Gait & Station: normal Patient leans: N/A   Psychiatric Specialty Exam: Physical Exam  ROS- no tremors, no diaphoresis, no nausea, no vomiting   Blood pressure 118/87, pulse 67, temperature 97.4 F (36.3 C), temperature source Oral, resp. rate 18, height _0  (1.753 m), weight 156 lb 15.5 oz (71.2 kg), SpO2 100 %.Body mass index is 23.17 kg/(m^2).  General Appearance: Fairly Groomed  Engineer, water::  Good  Speech:  Normal Rate  Volume:  Normal  Mood:  improved but still depressed   Affect:  Constricted and vaguely irritable- improved compared to admission  Thought Process:  Linear  Orientation:  Full (Time, Place, and Person)  Thought Content:  no hallucinations, no delusions, not internally preoccupied   Suicidal Thoughts:  No at this time denies any SI or self injurious ideations  Homicidal Thoughts:  No  Memory:  recent and remote grossly intact   Judgement:  Fair  Insight:  Fair  Psychomotor Activity:  Normal- no tremors or restlessness at this time  Concentration:  Good  Recall:  Good  Fund of Knowledge:Good  Language: Good  Akathisia:  Negative  Handed:  Right  AIMS (if indicated):     Assets:  Desire for Improvement Physical Health Resilience  ADL's:  Improving   Cognition: WNL  Sleep:        Current Medications: Current Facility-Administered Medications  Medication Dose Route Frequency Provider Last Rate Last Dose  . acetaminophen (TYLENOL) tablet 650 mg  650 mg Oral Q6H  PRN Niel Hummer, NP   650 mg at 01/24/15 1539  . alum & mag hydroxide-simeth (MAALOX/MYLANTA) 200-200-20 MG/5ML suspension 30 mL  30 mL Oral Q4H PRN Niel Hummer, NP      . chlordiazePOXIDE (LIBRIUM) capsule 25 mg  25 mg Oral Q6H PRN Niel Hummer, NP      . chlordiazePOXIDE (LIBRIUM) capsule 25 mg  25 mg Oral TID Niel Hummer, NP   25 mg at 01/25/15 1209   Followed by  . [START ON 01/26/2015] chlordiazePOXIDE (LIBRIUM) capsule 25 mg  25 mg Oral BH-qamhs Niel Hummer, NP       Followed by  . [START ON 01/28/2015] chlordiazePOXIDE (LIBRIUM) capsule 25 mg  25 mg Oral Daily Niel Hummer, NP      . FLUoxetine (PROZAC) capsule 10 mg  10 mg Oral Daily Niel Hummer, NP   10 mg at 01/25/15 0805  . hydrocortisone ointment 0.5 %   Topical TID PRN Niel Hummer, NP      . hydrOXYzine (ATARAX/VISTARIL) tablet 25 mg  25 mg Oral Q6H PRN Niel Hummer, NP      . loperamide (IMODIUM) capsule 2-4 mg  2-4 mg Oral PRN Niel Hummer, NP      . loratadine (CLARITIN) tablet 10 mg  10 mg Oral Daily Niel Hummer, NP   10 mg at 01/25/15 0805  . magnesium hydroxide (MILK OF MAGNESIA) suspension 30 mL  30 mL Oral Daily PRN Niel Hummer, NP      . multivitamin with minerals tablet 1 tablet  1 tablet Oral Daily Niel Hummer, NP   1 tablet at 01/25/15 0804  . nicotine polacrilex (NICORETTE) gum 2 mg  2 mg Oral PRN Nicholaus Bloom, MD      . ondansetron (ZOFRAN-ODT) disintegrating tablet 4 mg  4 mg Oral Q6H PRN Niel Hummer, NP      . thiamine (VITAMIN B-1) tablet 100 mg  100 mg Oral Daily Niel Hummer, NP   100 mg at 01/25/15 0804  . traZODone (DESYREL) tablet 50 mg  50 mg Oral QHS PRN Niel Hummer, NP   50 mg at 01/24/15 2140    Lab Results:  Results for orders placed or performed during the hospital encounter of 01/24/15 (from the past 48 hour(s))  CBC WITH DIFFERENTIAL     Status: None   Collection Time: 01/24/15  3:37 AM  Result Value Ref Range   WBC 7.7 4.0 - 10.5 K/uL   RBC 4.82 4.22 - 5.81 MIL/uL    Hemoglobin 15.3 13.0 - 17.0 g/dL   HCT 45.2 39.0 - 52.0 %   MCV 93.8 78.0 - 100.0 fL   MCH 31.7 26.0 - 34.0 pg   MCHC 33.8 30.0 - 36.0 g/dL   RDW 12.9 11.5 - 15.5 %   Platelets 221 150 - 400 K/uL   Neutrophils Relative % 56 43 -  77 %   Neutro Abs 4.3 1.7 - 7.7 K/uL   Lymphocytes Relative 32 12 - 46 %   Lymphs Abs 2.5 0.7 - 4.0 K/uL   Monocytes Relative 8 3 - 12 %   Monocytes Absolute 0.6 0.1 - 1.0 K/uL   Eosinophils Relative 4 0 - 5 %   Eosinophils Absolute 0.3 0.0 - 0.7 K/uL   Basophils Relative 0 0 - 1 %   Basophils Absolute 0.0 0.0 - 0.1 K/uL  Comprehensive metabolic panel     Status: Abnormal   Collection Time: 01/24/15  3:37 AM  Result Value Ref Range   Sodium 139 135 - 145 mmol/L   Potassium 3.5 3.5 - 5.1 mmol/L   Chloride 108 101 - 111 mmol/L   CO2 20 (L) 22 - 32 mmol/L   Glucose, Bld 88 65 - 99 mg/dL   BUN 8 6 - 20 mg/dL   Creatinine, Ser 1.02 0.61 - 1.24 mg/dL   Calcium 9.1 8.9 - 10.3 mg/dL   Total Protein 7.9 6.5 - 8.1 g/dL   Albumin 4.7 3.5 - 5.0 g/dL   AST 31 15 - 41 U/L   ALT 31 17 - 63 U/L   Alkaline Phosphatase 80 38 - 126 U/L   Total Bilirubin 0.6 0.3 - 1.2 mg/dL   GFR calc non Af Amer >60 >60 mL/min   GFR calc Af Amer >60 >60 mL/min    Comment: (NOTE) The eGFR has been calculated using the CKD EPI equation. This calculation has not been validated in all clinical situations. eGFR's persistently <60 mL/min signify possible Chronic Kidney Disease.    Anion gap 11 5 - 15  Ethanol     Status: Abnormal   Collection Time: 01/24/15  3:37 AM  Result Value Ref Range   Alcohol, Ethyl (B) 195 (H) <5 mg/dL    Comment:        LOWEST DETECTABLE LIMIT FOR SERUM ALCOHOL IS 5 mg/dL FOR MEDICAL PURPOSES ONLY   Acetaminophen level     Status: Abnormal   Collection Time: 01/24/15  3:37 AM  Result Value Ref Range   Acetaminophen (Tylenol), Serum <10 (L) 10 - 30 ug/mL    Comment:        THERAPEUTIC CONCENTRATIONS VARY SIGNIFICANTLY. A RANGE OF 10-30 ug/mL MAY BE  AN EFFECTIVE CONCENTRATION FOR MANY PATIENTS. HOWEVER, SOME ARE BEST TREATED AT CONCENTRATIONS OUTSIDE THIS RANGE. ACETAMINOPHEN CONCENTRATIONS >150 ug/mL AT 4 HOURS AFTER INGESTION AND >50 ug/mL AT 12 HOURS AFTER INGESTION ARE OFTEN ASSOCIATED WITH TOXIC REACTIONS.   Salicylate level     Status: None   Collection Time: 01/24/15  3:37 AM  Result Value Ref Range   Salicylate Lvl <5.0 2.8 - 30.0 mg/dL    Physical Findings: AIMS: Facial and Oral Movements Muscles of Facial Expression: None, normal Lips and Perioral Area: None, normal Jaw: None, normal Tongue: None, normal,Extremity Movements Upper (arms, wrists, hands, fingers): None, normal Lower (legs, knees, ankles, toes): None, normal, Trunk Movements Neck, shoulders, hips: None, normal, Overall Severity Severity of abnormal movements (highest score from questions above): None, normal Incapacitation due to abnormal movements: None, normal Patient's awareness of abnormal movements (rate only patient's report): No Awareness, Dental Status Current problems with teeth and/or dentures?: No Does patient usually wear dentures?: No  CIWA:  CIWA-Ar Total: 0 COWS:     Assessment - patient remains mildly dysphoric, irritable, depressed, but no SI. At this time not presenting with any significant alcohol WDL symptoms and vitals are  stable. He does endorse alcohol cravings and is interested in [place] trial. He is tolerating medications well.   Treatment Plan Summary: Daily contact with patient to assess and evaluate symptoms and progress in treatment, Medication management, Plan ongoing inpatient treatment and medications as below Start CAMPRAL 666 mgrs TID to address alcohol cravings. Continue Librium detox protocol to minimize risk of significant alcohol WDL. Increase Prozac to 20 mgrs QDAY to address depression Continue Trazodone 50 mgrs QHS PRN for insomnia Continue Claritin for seasonal allergies . Will review with CSW, regarding  patient's request for communication/letter to his lawyer regarding being in hospital at this time.  Medical Decision Making:  Established Problem, Stable/Improving (1), Review of Psycho-Social Stressors (1), Review or order clinical lab tests (1), Review of Medication Regimen & Side Effects (2) and Review of New Medication or Change in Dosage (2)     COBOS, FERNANDO 01/25/2015, 12:39 PM

## 2015-01-25 NOTE — Progress Notes (Signed)
D. Pt had been up and visible in milieu this evening, did attend and participate in evening group activity. Pt has appeared depressed on the unit and seen pacing a lot in the hallways and has endorsed anxiety and restlessness this evening and did receive medications without incident this evening. A. Support and encouragement provided. R. Safety maintained, will continue to monitor.

## 2015-01-25 NOTE — BHH Group Notes (Signed)
[facility] LCSW Aftercare Discharge Planning Group Note  01/25/2015 8:45 AM  Pt did not attend, declined invitation.   Chad Cordial, LCSWA 01/25/2015 11:00 AM

## 2015-01-25 NOTE — Progress Notes (Signed)
Adult Psychoeducational Group Note  Date:  01/25/2015 Time:  9:36 PM  Group Topic/Focus:  Wrap-Up Group:   The focus of this group is to help patients review their daily goal of treatment and discuss progress on daily workbooks.  Participation Level:  Active  Participation Quality:  Appropriate  Affect:  Appropriate  Cognitive:  Appropriate  Insight: Appropriate  Engagement in Group:  Engaged  Modes of Intervention:  Discussion  Additional Comments: The patient expressed that he learned that you can talk to people when you depressed.The also said that he attended group.  Darren Griffin 01/25/2015, 9:36 PM

## 2015-01-26 MED ORDER — TRAZODONE HCL 100 MG PO TABS
100.0000 mg | ORAL_TABLET | Freq: Every day | ORAL | Status: DC
  Administered 2015-01-26: 100 mg via ORAL
  Filled 2015-01-26 (×2): qty 1
  Filled 2015-01-26: qty 14

## 2015-01-26 NOTE — BHH Group Notes (Signed)
B[facility] Group Notes:  (Nursing/MHT/Case Management/Adjunct)  Date:  01/26/2015  Time:  0900 am  Type of Therapy:  Psychoeducational Skills  Participation Level:  Did Not Attend  Patient invited; elected to not attend.  Cranford Mon 01/26/2015, 9:39 AM

## 2015-01-26 NOTE — BHH Group Notes (Signed)
[facility] Mental Health Association Group Therapy 01/26/2015 1:15pm  Type of Therapy: Mental Health Association Presentation  Participation Level: Active  Participation Quality: Attentive  Affect: Appropriate  Cognitive: Oriented  Insight: Developing/Improving  Engagement in Therapy: Engaged  Modes of Intervention: Discussion, Education and Socialization  Summary of Progress/Problems: Mental Health Association (MHA) Speaker came to talk about his personal journey with substance abuse and addiction. The pt processed ways by which to relate to the speaker. MHA speaker provided handouts and educational information pertaining to groups and services offered by the [facility]. Pt was engaged in speaker's presentation and was receptive to resources provided.    Chad Cordial, LCSWA 01/26/2015 1:36 PM

## 2015-01-26 NOTE — Progress Notes (Signed)
Pt reports he has still been feeling anxious and is having some mild withdrawal symptoms.  He denies SI/HI/AVH at this time.  He stayed to himself most of the shift.  He used his hydrocortisone cream for the rash to his arms.  He says it is helping.  He is anxious to get back home.  He wants to stay sober for his family.  Support and encouragement offered.  Pt makes his needs and concerns known to staff.  Safety maintained with q15 minute checks.

## 2015-01-26 NOTE — Progress Notes (Signed)
Did not attend group, remained sleep in his room.

## 2015-01-26 NOTE — Progress Notes (Signed)
Patient ID: Darren Griffin, male   DOB: 04-19-1986, 29 y.o.   MRN: [id number] [facility] MD Progress Note  01/26/2015 2:54 PM Darren Griffin  MRN:  [id number]  Subjective:  Darren Griffin says he is feeling better today. However, he is complaining of insomnia. Says, he tossed & turned all night long. Says his goal after discharge is to hang around good/positive people to do positive things. Reports improved mood. Denies SIHI, AVH.   Objective :  I have discussed case with treatment team & met with patient. Patient currently calm, in no acute distress, no distal tremors noted, no diaphoresis, vitals stable. No visual disturbances endorsed. Says his mood is improving. Staff say he sleeps a lot during the day & Darren Griffin says it is because he does not sleep well at night. Increased Trazodone to 100 mg Q hs routinely. Denies medication side effects. Currently on CAMPRAL trial to address cravings. Some group participation- sleeps a lot during.  Principal Problem: Alcohol dependence with alcohol-induced mood disorder Diagnosis:   Patient Active Problem List   Diagnosis Date Noted  . Alcohol abuse [F10.10] 01/24/2015  . MDD (major depressive disorder), recurrent episode, severe [F33.2] 01/24/2015  . Alcohol dependence with alcohol-induced mood disorder [F10.24]   . Alcohol abuse with intoxication [F10.129] 12/31/2014  . ETOH abuse [F10.10] 12/30/2014   Total Time spent with patient: 20 minutes   Past Medical History:  Past Medical History  Diagnosis Date  . Asthma    History reviewed. No pertinent past surgical history. Family History: History reviewed. No pertinent family history. Social History:  History  Alcohol Use  . Yes    Comment: drunk now     History  Drug Use  . Yes  . Special: Marijuana    History   Social History  . Marital Status: Single    Spouse Name: N/A  . Number of Children: N/A  . Years of Education: N/A   Social History Main Topics  . Smoking status: Current Every Day Smoker -- 2.00  packs/day for 10 years    Types: Cigarettes  . Smokeless tobacco: Not on file  . Alcohol Use: Yes     Comment: drunk now  . Drug Use: Yes    Special: Marijuana  . Sexual Activity: Yes   Other Topics Concern  . None   Social History Narrative   Additional History:    Sleep: Poor  Appetite:  Good  Musculoskeletal: Strength & Muscle Tone: within normal limits Gait & Station: normal Patient leans: N/A  Psychiatric Specialty Exam: Physical Exam  ROS- no tremors, no diaphoresis, no nausea, no vomiting   Blood pressure 125/75, pulse 75, temperature 97.3 F (36.3 C), temperature source Oral, resp. rate 20, height _0  (1.753 m), weight 71.2 kg (156 lb 15.5 oz), SpO2 100 %.Body mass index is 23.17 kg/(m^2).  General Appearance: Fairly Groomed  Engineer, water::  Good  Speech:  Normal Rate  Volume:  Normal  Mood:  improved but still depressed   Affect:  Constricted and vaguely irritable- improved compared to admission  Thought Process:  Linear  Orientation:  Full (Time, Place, and Person)  Thought Content:  no hallucinations, no delusions, not internally preoccupied   Suicidal Thoughts:  No at this time denies any SI or self injurious ideations  Homicidal Thoughts:  No  Memory:  recent and remote grossly intact   Judgement:  Fair  Insight:  Fair  Psychomotor Activity:  Normal- no tremors or restlessness at this time  Concentration:  Good  Recall:  Darren Griffin of Knowledge:Good  Language: Good  Akathisia:  Negative  Handed:  Right  AIMS (if indicated):     Assets:  Desire for Improvement Physical Health Resilience  ADL's:  Improving   Cognition: WNL  Sleep:  Number of Hours: 4.5   Current Medications: Current Facility-Administered Medications  Medication Dose Route Frequency Provider Last Rate Last Dose  . acamprosate (CAMPRAL) tablet 666 mg  666 mg Oral TID WC Jenne Campus, MD   666 mg at 01/26/15 1315  . acetaminophen (TYLENOL) tablet 650 mg  650 mg Oral Q6H PRN  Niel Hummer, NP   650 mg at 01/24/15 1539  . alum & mag hydroxide-simeth (MAALOX/MYLANTA) 200-200-20 MG/5ML suspension 30 mL  30 mL Oral Q4H PRN Niel Hummer, NP      . chlordiazePOXIDE (LIBRIUM) capsule 25 mg  25 mg Oral Q6H PRN Niel Hummer, NP      . chlordiazePOXIDE (LIBRIUM) capsule 25 mg  25 mg Oral BH-qamhs Niel Hummer, NP       Followed by  . [START ON 01/28/2015] chlordiazePOXIDE (LIBRIUM) capsule 25 mg  25 mg Oral Daily Niel Hummer, NP      . FLUoxetine (PROZAC) capsule 20 mg  20 mg Oral Daily Jenne Campus, MD   20 mg at 01/26/15 0756  . hydrocortisone ointment 0.5 %   Topical TID PRN Niel Hummer, NP      . hydrOXYzine (ATARAX/VISTARIL) tablet 25 mg  25 mg Oral Q6H PRN Niel Hummer, NP   25 mg at 01/25/15 2206  . loperamide (IMODIUM) capsule 2-4 mg  2-4 mg Oral PRN Niel Hummer, NP      . loratadine (CLARITIN) tablet 10 mg  10 mg Oral Daily Niel Hummer, NP   10 mg at 01/26/15 0757  . magnesium hydroxide (MILK OF MAGNESIA) suspension 30 mL  30 mL Oral Daily PRN Niel Hummer, NP      . multivitamin with minerals tablet 1 tablet  1 tablet Oral Daily Niel Hummer, NP   1 tablet at 01/26/15 0757  . nicotine polacrilex (NICORETTE) gum 2 mg  2 mg Oral PRN Nicholaus Bloom, MD      . ondansetron (ZOFRAN-ODT) disintegrating tablet 4 mg  4 mg Oral Q6H PRN Niel Hummer, NP      . thiamine (VITAMIN B-1) tablet 100 mg  100 mg Oral Daily Niel Hummer, NP   100 mg at 01/26/15 0757  . traZODone (DESYREL) tablet 100 mg  100 mg Oral QHS Encarnacion Slates, NP        Lab Results:  No results found for this or any previous visit (from the past 48 hour(s)).  Physical Findings: AIMS: Facial and Oral Movements Muscles of Facial Expression: None, normal Lips and Perioral Area: None, normal Jaw: None, normal Tongue: None, normal,Extremity Movements Upper (arms, wrists, hands, fingers): None, normal Lower (legs, knees, ankles, toes): None, normal, Trunk Movements Neck, shoulders, hips:  None, normal, Overall Severity Severity of abnormal movements (highest score from questions above): None, normal Incapacitation due to abnormal movements: None, normal Patient's awareness of abnormal movements (rate only patient's report): No Awareness, Dental Status Current problems with teeth and/or dentures?: No Does patient usually wear dentures?: No  CIWA:  CIWA-Ar Total: 0 COWS:      Assessment - Ayub remains depressed, however denies SI. At this time not presenting with any significant alcohol WDL symptoms and  vitals are stable. He does endorse alcohol cravings & has started trial CAMPRAL. He is tolerating medications well.   Treatment Plan Summary: Daily contact with patient to assess and evaluate symptoms and progress in treatment, Medication management, Plan ongoing inpatient treatment and medications as below Continue CAMPRAL 666 mgrs TID to address alcohol cravings. Continue Librium detox protocol to minimize risk of significant alcohol symptoms. Continue Prozac 20 mgrs QDAY to address depression IncreasedTrazodone from 50 mg to 100 mg QHS for insomnia, routinely. Continue Claritin for seasonal allergies/itching . Will review with CSW, regarding patient's request for communication/letter to his lawyer regarding being in hospital at this time.  Medical Decision Making:  Established Problem, Stable/Improving (1), Review of Psycho-Social Stressors (1), Review or order clinical lab tests (1), Review of Medication Regimen & Side Effects (2) and Review of New Medication or Change in Dosage (2)  Encarnacion Slates, PMHNP-BC 01/26/2015, 2:54 PM I agree with assessment and plan Geralyn Flash A. Sabra Heck, M.D.

## 2015-01-26 NOTE — Tx Team (Signed)
Interdisciplinary Treatment Plan Update (Adult) Date: 01/26/2015   Date: 01/26/2015 1:42 PM  Progress in Treatment:  Attending groups: Yes  Participating in groups: Yes  Taking medication as prescribed: Yes  Tolerating medication: Yes  Family/Significant othe contact made: No, Pt declines family contact Patient understands diagnosis: Yes Discussing patient identified problems/goals with staff: Yes  Medical problems stabilized or resolved: Yes  Denies suicidal/homicidal ideation: Yes Patient has not harmed self or Others: Yes   New problem(s) identified: None identified at this time.   Discharge Plan or Barriers: Pt will return home and follow-up at [facility] in [place].  Additional comments: n/a   Reason for Continuation of Hospitalization:  Depression Medication stabilization Suicidal ideation Withdrawal symptoms  Estimated length of stay: 1-2 days  Review of initial/current patient goals per problem list:   1.  Goal(s): Patient will participate in aftercare plan  Met:  Yes  Target date: 01/26/2015   As evidenced by: Patient will participate within aftercare plan AEB aftercare provider and housing plan at discharge being identified.   7-28:  Pt will return home and follow-up at [facility] in [place].  2.  Goal (s): Patient will exhibit decreased depressive symptoms and suicidal ideations.  Met:  Yes  Target date: 01/26/2015   As evidenced by: Patient will utilize self rating of depression at 3 or below and demonstrate decreased signs of depression or be deemed stable for discharge by MD.  7/28: Pt reports decrease in depressive symptoms. Denies SI and rates depression at 3/10. Attendees:  Patient:    Family:    Physician: Dr. Lugo, MD  01/26/2015 1:42 PM  Nursing: Jennifer Clark, RN Case manager  01/26/2015 1:42 PM  Clinical Social Worker Lauren Carter, LCSWA, MSW 01/26/2015 1:42 PM  Other: Valerie Enoch, [facility] 01/26/2015 1:42 PM  Clinical:  Caroline  Beaudry, RN; Marian, RN 01/26/2015 1:42 PM  Other: , RN Charge Nurse 01/26/2015 1:42 PM  Other:     Lauren Carter, LCSWA MSW   

## 2015-01-26 NOTE — Progress Notes (Addendum)
D: Patient is pleasant and cooperative.  He reports decreased depressive symptoms.  He rates his depression and hopelessness as a 4; anxiety as a 5.  He denies any withdrawal symptoms or cravings. His main complaint is lack of sleep.  His trazodone has been increased for tonight.  His goal today is to "not think about a drink."  He has been interacting well with staff and peers.   A: Continue to monitor medication management and MD orders.  Safety checks completed every 15 minutes per protocol.  Offer encouragement and support as needed. R; Patient is receptive to staff.

## 2015-01-27 MED ORDER — LORATADINE 10 MG PO TABS: 10.0000 mg | ORAL_TABLET | Freq: Every day | ORAL | Status: DC

## 2015-01-27 MED ORDER — NICOTINE POLACRILEX 2 MG MT GUM: 2.0000 mg | CHEWING_GUM | OROMUCOSAL | Status: DC | PRN

## 2015-01-27 MED ORDER — ACAMPROSATE CALCIUM 333 MG PO TBEC: 666.0000 mg | DELAYED_RELEASE_TABLET | Freq: Three times a day (TID) | ORAL | Status: DC

## 2015-01-27 MED ORDER — FLUOXETINE HCL 20 MG PO CAPS: 20.0000 mg | ORAL_CAPSULE | Freq: Every day | ORAL | Status: DC

## 2015-01-27 MED ORDER — ADULT MULTIVITAMIN W/MINERALS CH: 1.0000 | ORAL_TABLET | Freq: Every day | ORAL | Status: DC

## 2015-01-27 MED ORDER — ALBUTEROL SULFATE HFA 108 (90 BASE) MCG/ACT IN AERS: 2.0000 | INHALATION_SPRAY | Freq: Four times a day (QID) | RESPIRATORY_TRACT | Status: AC | PRN

## 2015-01-27 MED ORDER — TRAZODONE HCL 100 MG PO TABS: 100.0000 mg | ORAL_TABLET | Freq: Every day | ORAL | Status: DC

## 2015-01-27 NOTE — [facility] Suicide Risk Assessment (Signed)
[facility] Discharge Suicide Risk Assessment   Demographic Factors:  Male  Total Time spent with patient: 30 minutes  Musculoskeletal: Strength & Muscle Tone: within normal limits Gait & Station: normal Patient leans: normal  Psychiatric Specialty Exam: Physical Exam  Review of Systems  Constitutional: Negative.   HENT: Negative.   Eyes: Negative.   Respiratory: Negative.   Cardiovascular: Negative.   Gastrointestinal: Negative.   Genitourinary: Negative.   Musculoskeletal: Negative.   Skin: Negative.   Neurological: Negative.   Endo/Heme/Allergies: Negative.   Psychiatric/Behavioral: Positive for substance abuse.    Blood pressure 93/65, pulse 72, temperature 97.2 F (36.2 C), temperature source Oral, resp. rate 14, height 5\' 9"  (1.753 m), weight 71.2 kg (156 lb 15.5 oz), SpO2 100 %.Body mass index is 23.17 kg/(m^2).  General Appearance: Fairly Groomed  Patent attorney::  Fair  Speech:  Clear and Coherent409  Volume:  Normal  Mood:  Euthymic  Affect:  Appropriate  Thought Process:  Coherent and Goal Directed  Orientation:  Full (Time, Place, and Person)  Thought Content:  plans as he moves on, relapse prevention plan  Suicidal Thoughts:  No  Homicidal Thoughts:  No  Memory:  Immediate;   Fair Recent;   Fair Remote;   Fair  Judgement:  Fair  Insight:  Present  Psychomotor Activity:  Normal  Concentration:  Fair  Recall:  [employer] of Knowledge:Fair  Language: Fair  Akathisia:  No  Handed:  Right  AIMS (if indicated):     Assets:  Desire for Improvement Housing Social Support  Sleep:  Number of Hours: 6.25  Cognition: WNL  ADL's:  Intact   Have you used any form of tobacco in the last 30 days? (Cigarettes, Smokeless Tobacco, Cigars, and/or Pipes): Yes  Has this patient used any form of tobacco in the last 30 days? (Cigarettes, Smokeless Tobacco, Cigars, and/or Pipes) Yes, Prescription not provided because: nicotine patches provided  Mental Status Per Nursing  Assessment::   On Admission:  NA  Current Mental Status by Physician: In full contact with reality. There are no active SI plans or intent. He states he feels ready to go. He is going to follow up with [facility]. States he is committed to pursue sobriety. He is going to be with his girlfriend and go to the beach this weekend   Loss Factors: NA  Historical Factors: NA  Risk Reduction Factors:   Positive social support  Continued Clinical Symptoms:  Alcohol/Substance Abuse/Dependencies  Cognitive Features That Contribute To Risk:  None    Suicide Risk:  Minimal: No identifiable suicidal ideation.  Patients presenting with no risk factors but with morbid ruminations; may be classified as minimal risk based on the severity of the depressive symptoms  Principal Problem: Alcohol dependence with alcohol-induced mood disorder Discharge Diagnoses:  Patient Active Problem List   Diagnosis Date Noted  . Alcohol abuse [F10.10] 01/24/2015  . MDD (major depressive disorder), recurrent episode, severe [F33.2] 01/24/2015  . Alcohol dependence with alcohol-induced mood disorder [F10.24]   . Alcohol abuse with intoxication [F10.129] 12/31/2014  . ETOH abuse [F10.10] 12/30/2014    Follow-up Information    Follow up with [facility] On 01/30/2015.   Why:  Please walk-in between 8am-11pm for your initial assessment.   Contact information:   [address] [place] [address] Phone: [phone]      Plan Of Care/Follow-up recommendations:  Activity:  as tolerated Diet:  regular Follow up as above Is patient on multiple antipsychotic therapies  at discharge:  No   Has Patient had three or more failed trials of antipsychotic monotherapy by history:  No  Recommended Plan for Multiple Antipsychotic Therapies: NA    Zaevion Parke A 01/27/2015, 12:20 PM

## 2015-01-27 NOTE — Progress Notes (Signed)
Discharge note:  Patient received all personal belongings, prescriptions and medication samples.  Reviewed discharge instructions and follow up information and patient indicated understanding.  Patient denies SI/HI/AVH.  Patient left ambulatory with a friend.
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
## 2015-01-27 NOTE — Progress Notes (Signed)
Recreation Therapy Notes  Date: 07.29.16 Time: 9:30 am Location: [address] Group Room  Group Topic: Stress Management  Goal Area(s) Addresses:  Patient will verbalize importance of using healthy stress management.  Patient will identify positive emotions associated with healthy stress management.   Intervention: Stress Management  Activity :  Progressive Muscle Relaxation.  LRT introduced and educated patients on the technique of progressive muscle relaxation.  A script was used to deliver the technique to the patients.  Patients were asked to follow the script read a loud by the LRT to engage in practicing the stress management technique.  Education:  Stress Management, Discharge Planning.   Education Outcome: Acknowledges edcuation/In group clarification offered/Needs additional education  Clinical Observations/Feedback: Patient did not attend group.   Caroll Rancher, LRT/CTRS         Caroll Rancher A 01/27/2015 3:13 PM

## 2015-01-27 NOTE — Progress Notes (Signed)
  [facility] Adult Case Management Discharge Plan :  Will you be returning to the same living situation after discharge:  Yes,  home with wife At discharge, do you have transportation home?: Yes,  Pt's mother to provide transportation Do you have the ability to pay for your medications: Yes,  Pt provided with supply and prescriptions  Release of information consent forms completed and in the chart;  Patient's signature needed at discharge.  Patient to Follow up at: Follow-up Information    Follow up with [facility] On 01/30/2015.   Why:  Please walk-in between 8am-11pm for your initial assessment.   Contact information:   [address] [place], [place] [address] Phone: [phone]      Patient denies SI/HI: Yes,  Pt denies    Safety Planning and Suicide Prevention discussed: Yes,  with Pt; Pt declines family contact  Have you used any form of tobacco in the last 30 days? (Cigarettes, Smokeless Tobacco, Cigars, and/or Pipes): Yes  Has patient been referred to the Quitline?: Patient refused referral  [name] 01/27/2015, 4:18 PM

## 2015-01-27 NOTE — Progress Notes (Signed)
Pt reports his day has been ok.  He says he is still feeling anxious, but is not having any withdrawal symptoms this evening.  He says he did not sleep well last night and was lying in bed at the beginning of the shift.  Pt was encouraged to get up and be active so that he could sleep tonight.  His trazodone was increased for tonight.  Writer discussed his scheduled meds for tonight and he is in agreement.  He denies SI/HI/AVH at this time.  He says he may be discharged tomorrow to return to his grandmother's home.  He says he helps take care of her.  He does not want long term rehab.  Pt makes his needs known to staff.   Support and encouragement offered.  Safety maintained with q15 minute checks.

## 2016-05-19 IMAGING — DX DG HAND COMPLETE 3+V*R*
3 series · 3 of 3 positions shown · non-contrast
Comparison: Right hand radiographs performed 11/11/2010

CLINICAL DATA: Laceration to the ulnar aspect of the right palm.
Initial encounter.

EXAM:
RIGHT HAND - COMPLETE 3+ VIEW

[hand pa]
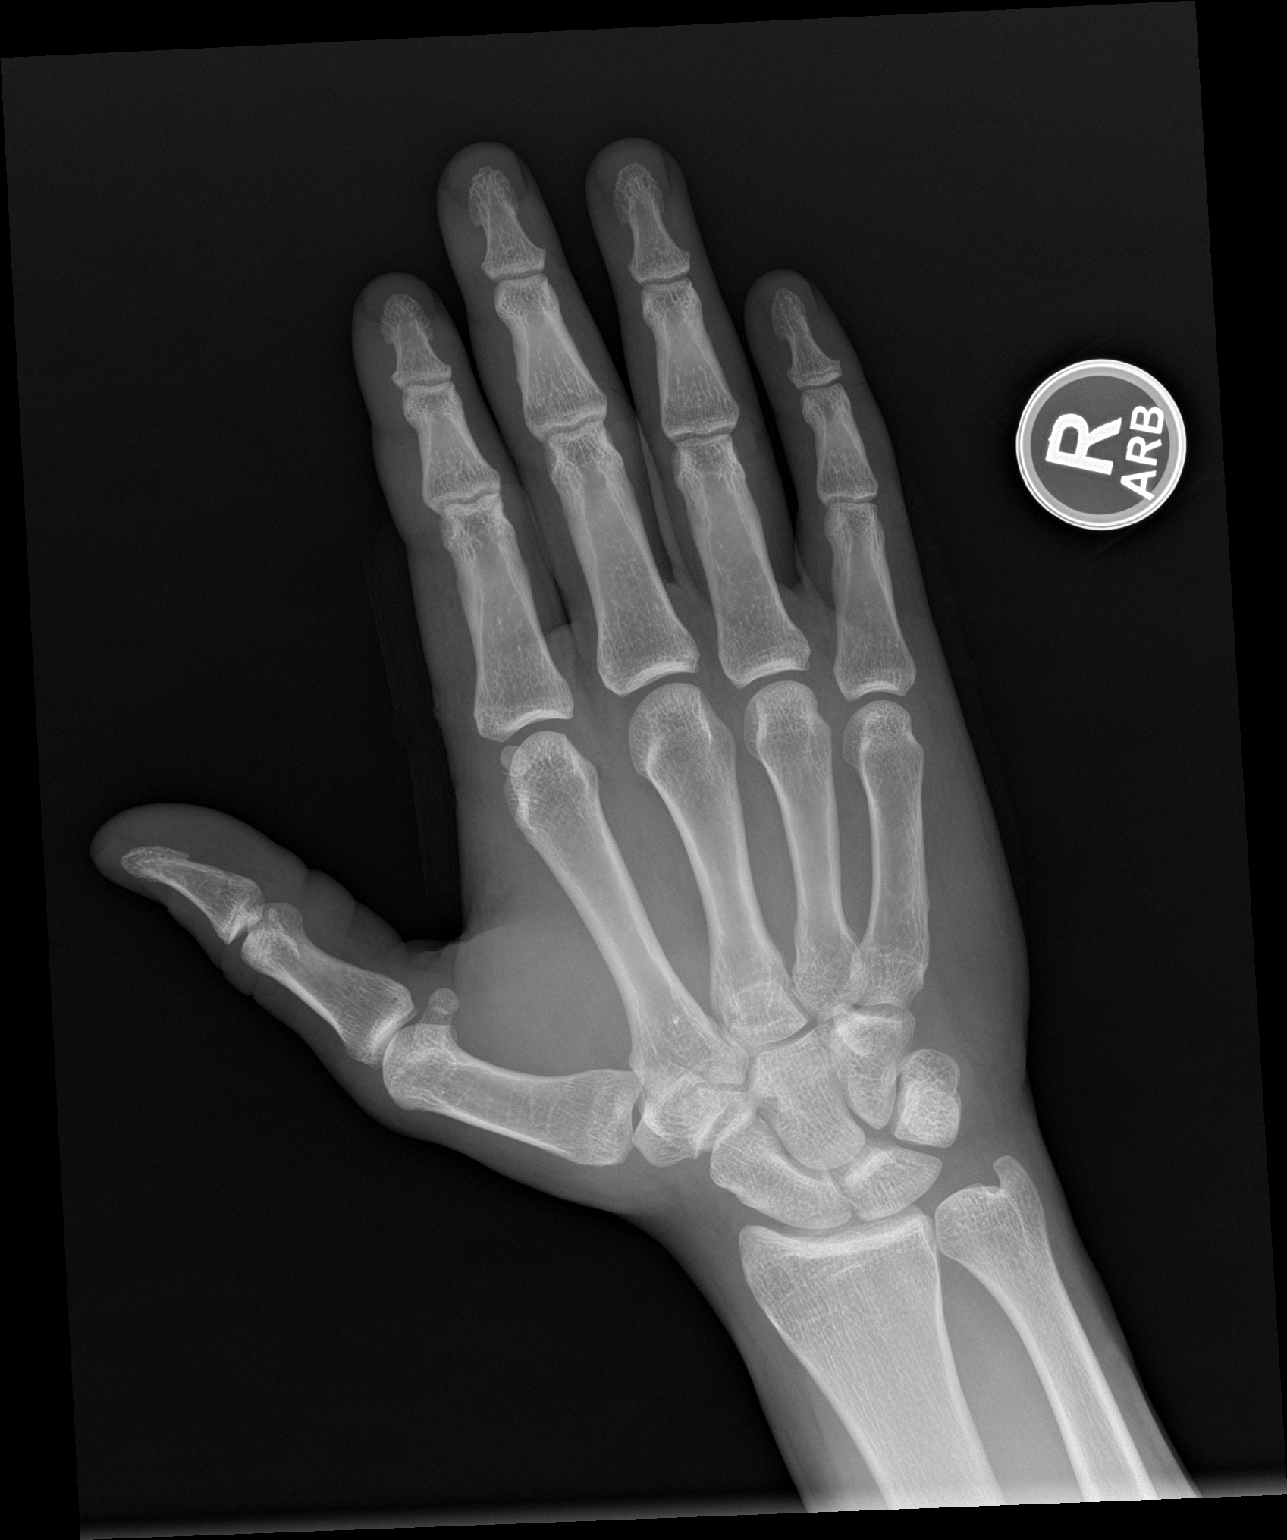

[hand obl]
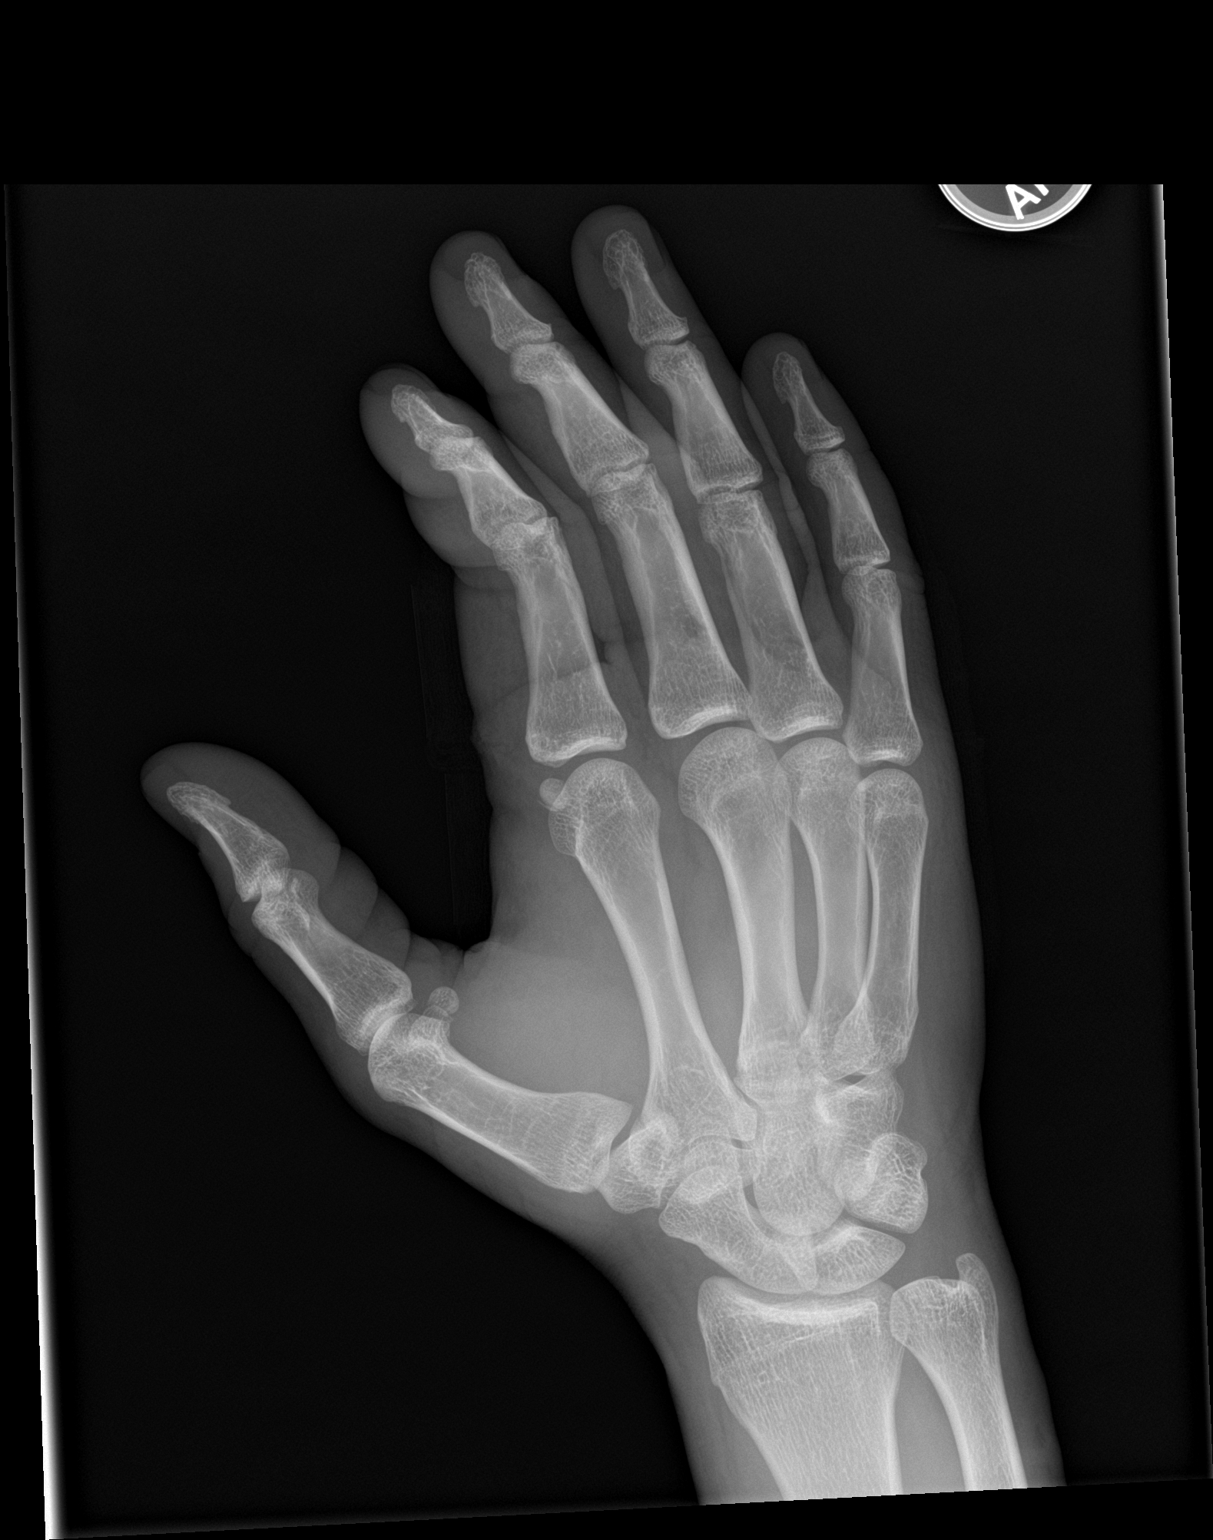

[hand lat]
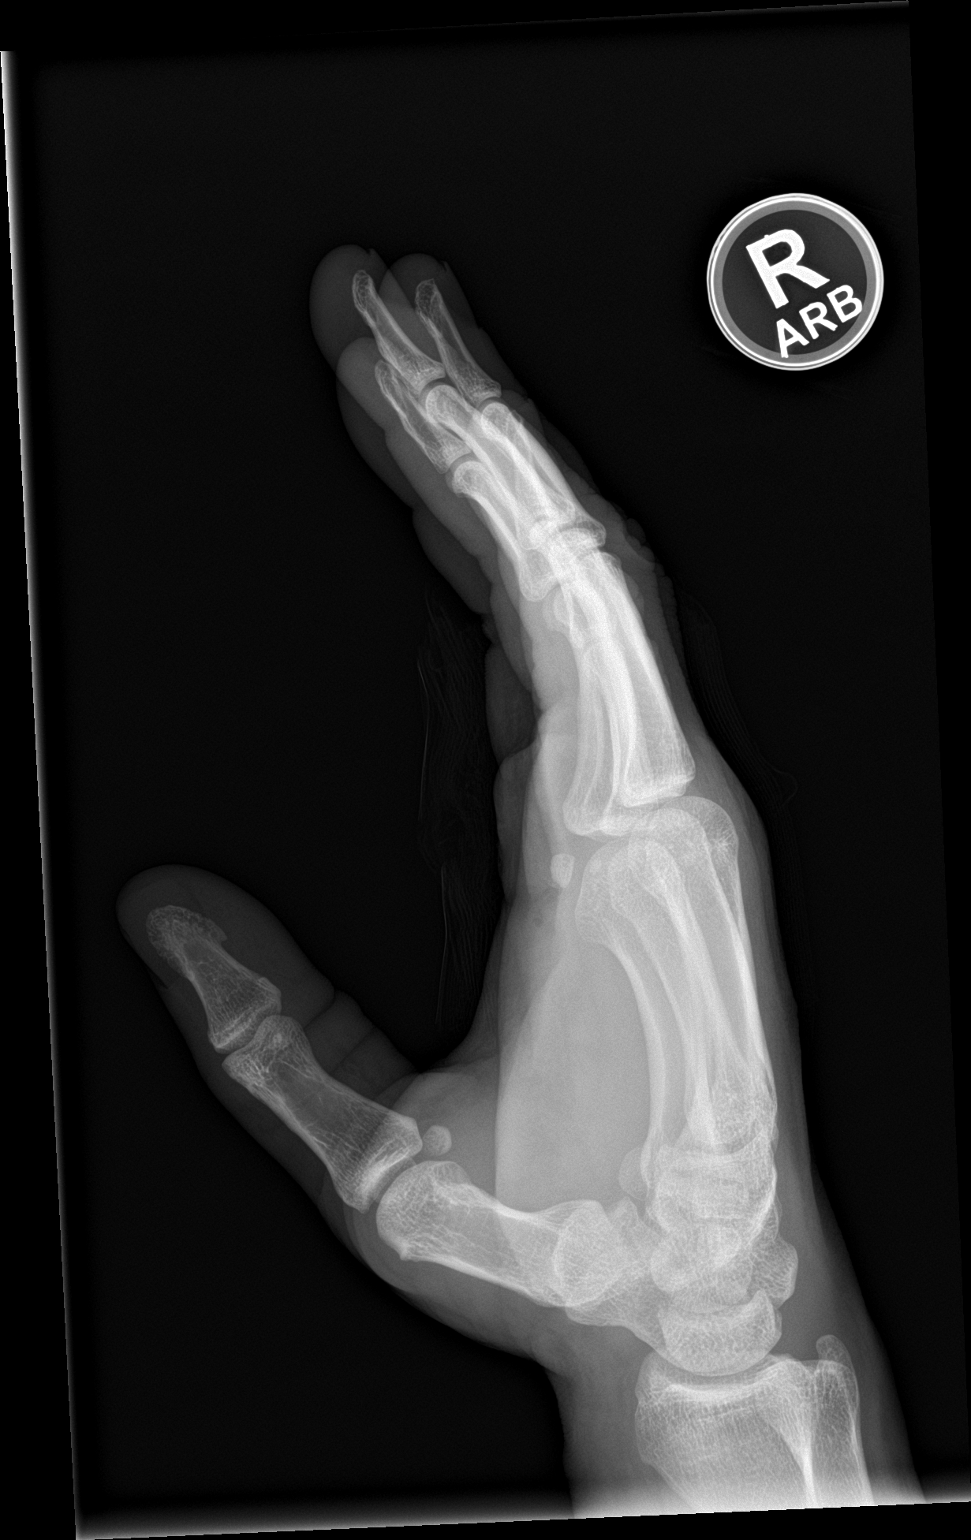

[3 of 3 positions shown; findings below may reference images not displayed]

FINDINGS: There is no evidence of fracture or dislocation. The joint spaces
are preserved. The carpal rows are intact, and demonstrate normal
alignment. The known soft tissue laceration is not well
characterized on radiograph. No radiopaque foreign bodies are seen.
IMPRESSION: No evidence of fracture or dislocation.

## 2016-11-11 ENCOUNTER — Emergency Department (HOSPITAL_COMMUNITY)
Admission: EM | Admit: 2016-11-11 | Discharge: 2016-11-11 | Disposition: A | Discharge status: Home / Self Care | Payer: No Typology Code available for payment source | Attending: Emergency Medicine | Admitting: Emergency Medicine

## 2016-11-11 ENCOUNTER — Encounter (HOSPITAL_COMMUNITY): Payer: Self-pay | Admitting: Emergency Medicine

## 2016-11-11 ENCOUNTER — Emergency Department (HOSPITAL_COMMUNITY): Payer: No Typology Code available for payment source

## 2016-11-11 DIAGNOSIS — M25552 Pain in left hip: Secondary | ICD-10-CM | POA: Diagnosis not present

## 2016-11-11 DIAGNOSIS — J45909 Unspecified asthma, uncomplicated: Secondary | ICD-10-CM | POA: Diagnosis not present

## 2016-11-11 DIAGNOSIS — Y999 Unspecified external cause status: Secondary | ICD-10-CM | POA: Insufficient documentation

## 2016-11-11 DIAGNOSIS — Y92410 Unspecified street and highway as the place of occurrence of the external cause: Secondary | ICD-10-CM | POA: Diagnosis not present

## 2016-11-11 DIAGNOSIS — F17210 Nicotine dependence, cigarettes, uncomplicated: Secondary | ICD-10-CM | POA: Insufficient documentation

## 2016-11-11 DIAGNOSIS — S79912A Unspecified injury of left hip, initial encounter: Secondary | ICD-10-CM | POA: Diagnosis present

## 2016-11-11 DIAGNOSIS — Y939 Activity, unspecified: Secondary | ICD-10-CM | POA: Insufficient documentation

## 2016-11-11 MED ORDER — NAPROXEN 250 MG PO TABS
500.0000 mg | ORAL_TABLET | Freq: Once | ORAL | Status: AC
Start: 2016-11-11 — End: 2016-11-11
  Administered 2016-11-11: 500 mg via ORAL
  Filled 2016-11-11: qty 2

## 2016-11-11 MED ORDER — NAPROXEN 500 MG PO TABS: 500.0000 mg | ORAL_TABLET | Freq: Two times a day (BID) | ORAL | 0 refills | Status: DC
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
## 2016-11-11 NOTE — ED Notes (Signed)
Pt up and walking upon entering pts room.

## 2016-11-11 NOTE — ED Notes (Signed)
Pt verbalized understanding of discharge instructions. Pt ambulatory to waiting room.  
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
## 2017-08-29 ENCOUNTER — Other Ambulatory Visit: Payer: Self-pay

## 2017-08-29 ENCOUNTER — Encounter (HOSPITAL_COMMUNITY): Payer: Self-pay | Admitting: Emergency Medicine

## 2017-08-29 ENCOUNTER — Emergency Department (HOSPITAL_COMMUNITY): Payer: Self-pay

## 2017-08-29 ENCOUNTER — Emergency Department (HOSPITAL_COMMUNITY)
Admission: EM | Admit: 2017-08-29 | Discharge: 2017-08-29 | Disposition: A | Discharge status: Home / Self Care | Payer: Self-pay | Attending: Emergency Medicine | Admitting: Emergency Medicine

## 2017-08-29 DIAGNOSIS — S92424A Nondisplaced fracture of distal phalanx of right great toe, initial encounter for closed fracture: Secondary | ICD-10-CM | POA: Insufficient documentation

## 2017-08-29 DIAGNOSIS — F17210 Nicotine dependence, cigarettes, uncomplicated: Secondary | ICD-10-CM | POA: Insufficient documentation

## 2017-08-29 DIAGNOSIS — W228XXA Striking against or struck by other objects, initial encounter: Secondary | ICD-10-CM | POA: Insufficient documentation

## 2017-08-29 DIAGNOSIS — J45909 Unspecified asthma, uncomplicated: Secondary | ICD-10-CM | POA: Insufficient documentation

## 2017-08-29 DIAGNOSIS — Y9289 Other specified places as the place of occurrence of the external cause: Secondary | ICD-10-CM | POA: Insufficient documentation

## 2017-08-29 DIAGNOSIS — Y9389 Activity, other specified: Secondary | ICD-10-CM | POA: Insufficient documentation

## 2017-08-29 DIAGNOSIS — Z79899 Other long term (current) drug therapy: Secondary | ICD-10-CM | POA: Insufficient documentation

## 2017-08-29 DIAGNOSIS — Y990 Civilian activity done for income or pay: Secondary | ICD-10-CM | POA: Insufficient documentation

## 2017-08-29 MED ORDER — HYDROCODONE-ACETAMINOPHEN 5-325 MG PO TABS
2.0000 | ORAL_TABLET | Freq: Once | ORAL | Status: AC
  Administered 2017-08-29: 2 via ORAL
  Filled 2017-08-29: qty 2

## 2017-08-29 MED ORDER — IBUPROFEN 800 MG PO TABS
800.0000 mg | ORAL_TABLET | Freq: Once | ORAL | Status: AC
  Administered 2017-08-29: 800 mg via ORAL
  Filled 2017-08-29: qty 1

## 2017-08-29 MED ORDER — IBUPROFEN 600 MG PO TABS: 600.0000 mg | ORAL_TABLET | Freq: Four times a day (QID) | ORAL | 0 refills | Status: DC

## 2017-08-29 MED ORDER — HYDROCODONE-ACETAMINOPHEN 5-325 MG PO TABS: 1.0000 | ORAL_TABLET | ORAL | 0 refills | Status: DC | PRN

## 2017-08-29 MED ORDER — ONDANSETRON HCL 4 MG PO TABS
4.0000 mg | ORAL_TABLET | Freq: Once | ORAL | Status: AC
  Administered 2017-08-29: 4 mg via ORAL
  Filled 2017-08-29: qty 1

## 2017-08-29 NOTE — Discharge Instructions (Signed)
You have a fracture of the right big toe.  A portion of this fracture involves the joint.  Please see Dr. Romeo AppleHarrison for orthopedic evaluation and management of this fracture.  Please keep your foot elevated on pillows above your waist when sitting, and elevated above your heart when lying down.  Apply ice to your foot.  Use ibuprofen with breakfast, lunch, dinner, and at bedtime.  This will help with swelling and pain and inflammation.  May use Norco for severe pain.  Norco may cause drowsiness.  Please do not drive a vehicle, drink alcohol, operate machinery, or participate in activities requiring concentration when taking this medication.

## 2017-08-29 NOTE — ED Notes (Signed)
Pt to xray

## 2017-08-29 NOTE — ED Triage Notes (Signed)
Pt states that he accidentally hit right foot on something at work, having pain and swelling in his right great toe

## 2017-08-29 NOTE — ED Provider Notes (Signed)
[facility] EMERGENCY DEPARTMENT Provider Note   CSN: [id number] Arrival date & time: 08/29/17  1843     History   Chief Complaint Chief Complaint  Patient presents with  . Foot Pain    HPI Darren Griffin is a 32 y.o. male.  Patient is a 32 year old male who presents to the emergency department with a complaint of right foot pain.  Patient states that he accidentally hit his foot on something at work today, he is been having pain since that time.  He particularly has been having pain involving the right great toe.  He notices some swelling.  He has pain with walking, as well as pain at rest.  The patient denies any recent operations or procedures involving the right foot.  He denies any anticoagulation medications, and denies any history of any bleeding disorders.  He presents now for assistance with this issue.      Past Medical History:  Diagnosis Date  . Asthma     Patient Active Problem List   Diagnosis Date Noted  . Alcohol abuse 01/24/2015  . MDD (major depressive disorder), recurrent episode, severe (HCC) 01/24/2015  . Alcohol dependence with alcohol-induced mood disorder (HCC)   . Alcohol abuse with intoxication (HCC) 12/31/2014  . ETOH abuse 12/30/2014    History reviewed. No pertinent surgical history.     Home Medications    Prior to Admission medications   Medication Sig Start Date End Date Taking? Authorizing Provider  acamprosate (CAMPRAL) 333 MG tablet Take 2 tablets (666 mg total) by mouth 3 (three) times daily with meals. For alcoholism 01/27/15   Armandina Stammer I, NP  albuterol (PROVENTIL HFA;VENTOLIN HFA) 108 (90 BASE) MCG/ACT inhaler Inhale 2 puffs into the lungs every 6 (six) hours as needed for wheezing or shortness of breath. 01/27/15   Armandina Stammer I, NP  FLUoxetine (PROZAC) 20 MG capsule Take 1 capsule (20 mg total) by mouth daily. For depression 01/27/15   Armandina Stammer I, NP  loratadine (CLARITIN) 10 MG tablet Take 1 tablet (10 mg total) by mouth  daily. (May purchase from over the counter): For allergies 01/27/15   Armandina Stammer I, NP  Multiple Vitamin (MULTIVITAMIN WITH MINERALS) TABS tablet Take 1 tablet by mouth daily. For low vitamin 01/27/15   Armandina Stammer I, NP  naproxen (NAPROSYN) 500 MG tablet Take 1 tablet (500 mg total) by mouth 2 (two) times daily. 11/11/16   Horton, Mayer Masker, MD  nicotine polacrilex (NICORETTE) 2 MG gum Take 1 each (2 mg total) by mouth as needed for smoking cessation. 01/27/15   Armandina Stammer I, NP  traZODone (DESYREL) 100 MG tablet Take 1 tablet (100 mg total) by mouth at bedtime. For sleep 01/27/15   Sanjuana Kava, NP    Family History History reviewed. No pertinent family history.  Social History Social History   Tobacco Use  . Smoking status: Current Every Day Smoker    Packs/day: 2.00    Years: 10.00    Pack years: 20.00    Types: Cigarettes  . Smokeless tobacco: Never Used  Substance Use Topics  . Alcohol use: Yes    Comment: drunk now  . Drug use: No     Allergies   Patient has no known allergies.   Review of Systems Review of Systems  Constitutional: Negative for activity change.       All ROS Neg except as noted in HPI  HENT: Negative for nosebleeds.   Eyes: Negative for photophobia and discharge.  Respiratory: Negative for cough, shortness of breath and wheezing.   Cardiovascular: Negative for chest pain and palpitations.  Gastrointestinal: Negative for abdominal pain and blood in stool.  Genitourinary: Negative for dysuria, frequency and hematuria.  Musculoskeletal: Negative for arthralgias, back pain and neck pain.       Right foot/toe pain.  Skin: Negative.   Neurological: Negative for dizziness, seizures and speech difficulty.  Psychiatric/Behavioral: Negative for confusion and hallucinations.     Physical Exam Updated Vital Signs BP 138/88 (BP Location: Right Arm)   Pulse (!) 111   Temp 99 F (37.2 C) (Oral)   Resp 20   Ht 5\' 9"  (1.753 m)   Wt 91.6 kg (202 lb)    SpO2 100%   BMI 29.83 kg/m   Physical Exam  Constitutional: He is oriented to person, place, and time. He appears well-developed and well-nourished.  Non-toxic appearance.  HENT:  Head: Normocephalic.  Right Ear: Tympanic membrane and external ear normal.  Left Ear: Tympanic membrane and external ear normal.  Eyes: EOM and lids are normal. Pupils are equal, round, and reactive to light.  Neck: Normal range of motion. Neck supple. Carotid bruit is not present.  Cardiovascular: Normal rate, regular rhythm, normal heart sounds, intact distal pulses and normal pulses.  Pulmonary/Chest: Breath sounds normal. No respiratory distress.  Abdominal: Soft. Bowel sounds are normal. There is no tenderness. There is no guarding.  Musculoskeletal: Normal range of motion.       Feet:  There is pain and swelling noted of the right great toe.  The dorsalis pedis and posterior tibial pulses are 2+.  Achilles tendon is intact.  Lymphadenopathy:       Head (right side): No submandibular adenopathy present.       Head (left side): No submandibular adenopathy present.    He has no cervical adenopathy.  Neurological: He is alert and oriented to person, place, and time. He has normal strength. No cranial nerve deficit or sensory deficit.  Skin: Skin is warm and dry.  Psychiatric: He has a normal mood and affect. His speech is normal.  Nursing note and vitals reviewed.    ED Treatments / Results  Labs (all labs ordered are listed, but only abnormal results are displayed) Labs Reviewed - No data to display  EKG  EKG Interpretation None       Radiology No results found.  Procedures FRACTURE CARE. Marland KitchenSplint Application Date/Time: 08/29/2017 8:29 PM Performed by: Ivery Quale, PA-C Authorized by: Ivery Quale, PA-C   Consent:    Consent obtained:  Verbal   Consent given by:  Patient   Risks discussed:  Pain and swelling Pre-procedure details:    Sensation:  Normal   Skin color:   Bruised Procedure details:    Laterality:  Right   Location:  Toe   Toe:  R big toe   Splint type: buddy tape.   Supplies:  Cotton padding (post op shoe, kling) Post-procedure details:    Pain:  Unchanged   Sensation:  Normal   Skin color:  Normal   Patient tolerance of procedure:  Tolerated well, no immediate complications   (including critical care time)  Medications Ordered in ED Medications - No data to display   Initial Impression / Assessment and Plan / ED Course  I have reviewed the triage vital signs and the nursing notes.  Pertinent labs & imaging results that were available during my care of the patient were reviewed by me and considered in my  medical decision making (see chart for details).       Final Clinical Impressions(s) / ED Diagnoses MDM Patient struck a table while at work and injured the right foot, in particular the right big toe.  X-ray of the right foot shows a nondisplaced fracture at the base of the first proximal phalanx involving the articular surface.  There is also noted an old healed fifth metatarsal shaft fracture with mild posttraumatic deformity.  Patient fitted with splint, postop shoe, and crutches.  Prescription given for ibuprofen and Norco.  Patient referred to Dr. Romeo AppleHarrison for orthopedic follow-up and management.  Patient is in agreement with this plan.    Final diagnoses:  Nondisplaced fracture of distal phalanx of right great toe, initial encounter for closed fracture    ED Discharge Orders        Ordered    HYDROcodone-acetaminophen (NORCO/VICODIN) 5-325 MG tablet  Every 4 hours PRN     08/29/17 2036    ibuprofen (ADVIL,MOTRIN) 600 MG tablet  4 times daily     08/29/17 2036       Ivery QualeBryant, Starlena Beil, PA-C 08/29/17 2041    Margarita Grizzleay, Danielle, MD 08/31/17 [phone]
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
## 2017-08-29 NOTE — ED Notes (Signed)
Pt back from x-ray.

## 2017-09-11 ENCOUNTER — Encounter: Payer: Self-pay | Admitting: Orthopedic Surgery

## 2017-09-11 ENCOUNTER — Ambulatory Visit: Payer: Self-pay | Admitting: Orthopedic Surgery

## 2017-09-11 VITALS — BP 179/118 | HR 98 | Ht 69.0 in | Wt 200.0 lb

## 2017-09-11 DIAGNOSIS — S92416A Nondisplaced fracture of proximal phalanx of unspecified great toe, initial encounter for closed fracture: Secondary | ICD-10-CM

## 2017-09-11 MED ORDER — IBUPROFEN 800 MG PO TABS: 800.0000 mg | ORAL_TABLET | Freq: Three times a day (TID) | ORAL | 1 refills | Status: DC | PRN

## 2017-09-11 MED ORDER — HYDROCODONE-ACETAMINOPHEN 5-325 MG PO TABS: 1.0000 | ORAL_TABLET | Freq: Four times a day (QID) | ORAL | 0 refills | Status: DC | PRN

## 2017-09-11 NOTE — Patient Instructions (Signed)
oow 3/1 to 3/29

## 2017-09-11 NOTE — Progress Notes (Signed)
NEW PATIENT OFFICE VISIT   Chief Complaint  Patient presents with  . 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  Painful range of motion.  Painful ambulation.  Summary great toe pain moderate right great toe 2 weeks painful weightbearing    Review of Systems  Constitutional: Negative for chills, fever and weight loss.  Respiratory: Negative for shortness of breath.   Cardiovascular: Negative for chest pain.  Neurological: Negative for tingling.     Past Medical History:  Diagnosis Date  . Asthma     History reviewed. No pertinent surgical history.  History reviewed. No pertinent family history. Social History   Tobacco Use  . Smoking status: Current Every Day Smoker    Packs/day: 2.00    Years: 10.00    Pack years: 20.00    Types: Cigarettes  . Smokeless tobacco: Never Used  Substance Use Topics  . Alcohol use: Yes    Comment: drunk now  . Drug use: No      Current Meds  Medication Sig  . HYDROcodone-acetaminophen (NORCO/VICODIN) 5-325 MG tablet Take 1 tablet by mouth every 4 (four) hours as needed.  Marland Kitchen. ibuprofen (ADVIL,MOTRIN) 600 MG tablet Take 1 tablet (600 mg total) by mouth 4 (four) times daily.    BP (!) 179/118   Pulse 98   Ht 5\' 9"  (1.753 m)   Wt 200 lb (90.7 kg)   BMI 29.53 kg/m   Physical Exam  Constitutional: He is oriented to person, place, and time. He appears well-developed and well-nourished.  Vital signs have been reviewed and are stable. Gen. appearance the patient is well-developed and well-nourished with normal grooming and hygiene.   Neurological: He is alert and oriented to person, place, and time. Gait abnormal.   Skin: Skin is warm and dry. No erythema.  Psychiatric: He has a normal mood and affect.  Vitals reviewed.   Ortho Exam Right great toe tender mild swelling tender at the proximal phalanx painful range of motion throughout the full range of motion which is limited to 10 degrees no instability at the metatarsophalangeal joint strength no atrophy skin closed pulse normal sensation normal   MEDICAL DECISION SECTION  xrays ordered?  No  My independent reading of xrays: Nondisplaced fracture of the proximal aspect of the right great toe is somewhat comminuted and there is some arthritis at the joint   Encounter Diagnosis  Name Primary?  . Nondisplaced fracture of proximal phalanx of unspecified great toe, initial encounter for closed fracture Yes     PLAN:   [place] [employer] [place] before prescription was given no matching patient was  Meds ordered this encounter  Medications  . ibuprofen (ADVIL,MOTRIN) 800 MG tablet    Sig: Take 1 tablet (800 mg total) by mouth every 8 (eight) hours as needed.    Dispense:  90 tablet    Refill:  1  . HYDROcodone-acetaminophen (NORCO) 5-325 MG tablet    Sig: Take 1 tablet by mouth every 6 (six) hours as needed for moderate pain.    Dispense:  12 tablet    Refill:  0  No work for 2 weeks follow-up 2 weeks x-ray 2 weeks postop shoe 2 weeks 3 days of Norco and then continue with ibuprofen

## 2017-09-24 DIAGNOSIS — S92416A Nondisplaced fracture of proximal phalanx of unspecified great toe, initial encounter for closed fracture: Secondary | ICD-10-CM | POA: Insufficient documentation

## 2017-09-25 ENCOUNTER — Ambulatory Visit: Payer: Self-pay | Admitting: Orthopedic Surgery

## 2017-09-25 ENCOUNTER — Encounter: Payer: Self-pay | Admitting: Orthopedic Surgery

## 2017-09-25 NOTE — Progress Notes (Deleted)
Fracture care follow-up  No chief complaint on file.   Encounter Diagnosis  Name Primary?  . Closed displaced fracture of proximal phalanx of right great toe with routine healing, subsequent encounter 08/29/17 Yes        Current Outpatient Medications:  .  acamprosate (CAMPRAL) 333 MG tablet, Take 2 tablets (666 mg total) by mouth 3 (three) times daily with meals. For alcoholism (Patient not taking: Reported on 09/11/2017), Disp: 180 tablet, Rfl: 0 .  albuterol (PROVENTIL HFA;VENTOLIN HFA) 108 (90 BASE) MCG/ACT inhaler, Inhale 2 puffs into the lungs every 6 (six) hours as needed for wheezing or shortness of breath. (Patient not taking: Reported on 09/11/2017), Disp: , Rfl:  .  FLUoxetine (PROZAC) 20 MG capsule, Take 1 capsule (20 mg total) by mouth daily. For depression (Patient not taking: Reported on 09/11/2017), Disp: 30 capsule, Rfl: 0 .  HYDROcodone-acetaminophen (NORCO) 5-325 MG tablet, Take 1 tablet by mouth every 6 (six) hours as needed for moderate pain., Disp: 12 tablet, Rfl: 0 .  ibuprofen (ADVIL,MOTRIN) 800 MG tablet, Take 1 tablet (800 mg total) by mouth every 8 (eight) hours as needed., Disp: 90 tablet, Rfl: 1 .  traZODone (DESYREL) 100 MG tablet, Take 1 tablet (100 mg total) by mouth at bedtime. For sleep (Patient not taking: Reported on 09/11/2017), Disp: 30 tablet, Rfl: 0  There were no vitals taken for this visit.  Physical Exam   Xrays:   Plan   ***
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
## 2021-08-10 ENCOUNTER — Emergency Department (HOSPITAL_COMMUNITY): Payer: Self-pay

## 2021-08-10 ENCOUNTER — Other Ambulatory Visit: Payer: Self-pay

## 2021-08-10 ENCOUNTER — Encounter (HOSPITAL_COMMUNITY): Payer: Self-pay

## 2021-08-10 ENCOUNTER — Emergency Department (HOSPITAL_COMMUNITY)
Admission: EM | Admit: 2021-08-10 | Discharge: 2021-08-10 | Disposition: A | Discharge status: Home / Self Care | Payer: Self-pay | Attending: Emergency Medicine | Admitting: Emergency Medicine

## 2021-08-10 DIAGNOSIS — J45901 Unspecified asthma with (acute) exacerbation: Secondary | ICD-10-CM

## 2021-08-10 DIAGNOSIS — Z20822 Contact with and (suspected) exposure to covid-19: Secondary | ICD-10-CM | POA: Insufficient documentation

## 2021-08-10 DIAGNOSIS — J4541 Moderate persistent asthma with (acute) exacerbation: Secondary | ICD-10-CM | POA: Insufficient documentation

## 2021-08-10 LAB — CBC WITH DIFFERENTIAL/PLATELET
Abs Immature Granulocytes: 0.01 10*3/uL (ref 0.00–0.07)
Basophils Absolute: 0 10*3/uL (ref 0.0–0.1)
Basophils Relative: 1 %
Eosinophils Absolute: 0.6 10*3/uL — ABNORMAL HIGH (ref 0.0–0.5)
Eosinophils Relative: 8 %
HCT: 43.4 % (ref 39.0–52.0)
Hemoglobin: 14.4 g/dL (ref 13.0–17.0)
Immature Granulocytes: 0 %
Lymphocytes Relative: 35 %
Lymphs Abs: 2.3 10*3/uL (ref 0.7–4.0)
MCH: 32.2 pg (ref 26.0–34.0)
MCHC: 33.2 g/dL (ref 30.0–36.0)
MCV: 97.1 fL (ref 80.0–100.0)
Monocytes Absolute: 0.5 10*3/uL (ref 0.1–1.0)
Monocytes Relative: 8 %
Neutro Abs: 3.1 10*3/uL (ref 1.7–7.7)
Neutrophils Relative %: 48 %
Platelets: 285 10*3/uL (ref 150–400)
RBC: 4.47 MIL/uL (ref 4.22–5.81)
RDW: 13.6 % (ref 11.5–15.5)
WBC: 6.6 10*3/uL (ref 4.0–10.5)
nRBC: 0 % (ref 0.0–0.2)

## 2021-08-10 LAB — BASIC METABOLIC PANEL
Anion gap: 9 (ref 5–15)
BUN: 11 mg/dL (ref 6–20)
CO2: 22 mmol/L (ref 22–32)
Calcium: 8.9 mg/dL (ref 8.9–10.3)
Chloride: 109 mmol/L (ref 98–111)
Creatinine, Ser: 0.96 mg/dL (ref 0.61–1.24)
GFR, Estimated: >60 mL/min (ref >60)
Glucose, Bld: 115 mg/dL — ABNORMAL HIGH (ref 70–99)
Potassium: 3.5 mmol/L (ref 3.5–5.1)
Sodium: 140 mmol/L (ref 135–145)

## 2021-08-10 LAB — RESP PANEL BY RT-PCR (FLU A&B, COVID) ARPGX2
Influenza A by PCR: NEGATIVE
Influenza B by PCR: NEGATIVE
SARS Coronavirus 2 by RT PCR: NEGATIVE

## 2021-08-10 LAB — TROPONIN I (HIGH SENSITIVITY)
Troponin I (High Sensitivity): 2 ng/L (ref <18)
Troponin I (High Sensitivity): 2 ng/L (ref <18)

## 2021-08-10 MED ORDER — ALBUTEROL SULFATE (2.5 MG/3ML) 0.083% IN NEBU
INHALATION_SOLUTION | RESPIRATORY_TRACT | Status: AC
  Administered 2021-08-10: 7.5 mg
  Filled 2021-08-10: qty 9

## 2021-08-10 MED ORDER — PREDNISONE 20 MG PO TABS: 40.0000 mg | ORAL_TABLET | Freq: Every day | ORAL | 0 refills | Status: AC

## 2021-08-10 MED ORDER — ALBUTEROL SULFATE HFA 108 (90 BASE) MCG/ACT IN AERS
2.0000 | INHALATION_SPRAY | Freq: Once | RESPIRATORY_TRACT | Status: AC
  Administered 2021-08-10: 2 via RESPIRATORY_TRACT
  Filled 2021-08-10: qty 6.7

## 2021-08-10 MED ORDER — METHYLPREDNISOLONE SODIUM SUCC 125 MG IJ SOLR
125.0000 mg | Freq: Once | INTRAMUSCULAR | Status: AC
  Administered 2021-08-10: 125 mg via INTRAVENOUS
  Filled 2021-08-10: qty 2

## 2021-08-10 MED ORDER — ALBUTEROL SULFATE (2.5 MG/3ML) 0.083% IN NEBU
5.0000 mg | INHALATION_SOLUTION | Freq: Once | RESPIRATORY_TRACT | Status: AC
Start: 2021-08-10 — End: 2021-08-10
  Administered 2021-08-10: 5 mg via RESPIRATORY_TRACT
  Filled 2021-08-10: qty 6

## 2021-08-10 MED ORDER — ALBUTEROL (5 MG/ML) CONTINUOUS INHALATION SOLN: 7.5000 mg/h | INHALATION_SOLUTION | Freq: Once | RESPIRATORY_TRACT | Status: DC

## 2021-08-10 NOTE — ED Notes (Signed)
Pt care taken, has had breathing treatment, no other complaints.

## 2021-08-10 NOTE — Discharge Instructions (Signed)
Start your prednisone prescription tomorrow.  Use the albuterol inhaler, 1 to 2 puffs every 4-6 hours as needed.  Please contact the provider listed to arrange follow-up and establish primary care.  Return to the emergency department for any new or worsening symptoms.

## 2021-08-10 NOTE — ED Triage Notes (Signed)
Chest tightness, sob that started while at work. Pt reports worsening with activity. Nasal congestion, cough started yesterday, denies fever. Denies body aches or chills.

## 2021-08-10 NOTE — ED Triage Notes (Signed)
Increased work of breathing noted in triage, O2 sats 98% on room air.

## 2021-08-10 NOTE — ED Provider Notes (Signed)
[facility] EMERGENCY DEPARTMENT Provider Note   CSN: [id number] Arrival date & time: 08/10/21  1300     History  Chief Complaint  Patient presents with   Shortness of Breath   Chest Pain    Darren Griffin is a 36 y.o. male.   Shortness of Breath Associated symptoms: chest pain and cough   Chest Pain Associated symptoms: cough and shortness of breath       Darren Griffin is a 36 y.o. male with past medical history for asthma and alcohol abuse who presents to the Emergency Department complaining of wheezing, chest tightness, and shortness of breath with exertion.  Symptoms have been present since earlier today.  States that he was at work when he became short of breath and wheezing.  Mild cough noted that is mostly been nonproductive.  He endorses having some nasal congestion also.  But denies fever, chills, and chest pain.  States he has been using his albuterol inhaler more frequently, but has not given him any relief from his shortness of breath.  He does endorse that he was recently moved to a different department at his place of employment and is exposed to chemical fumes.    Home Medications Prior to Admission medications   Medication Sig Start Date End Date Taking? Authorizing Provider  albuterol (PROVENTIL HFA;VENTOLIN HFA) 108 (90 BASE) MCG/ACT inhaler Inhale 2 puffs into the lungs every 6 (six) hours as needed for wheezing or shortness of breath. 01/27/15  Yes Nwoko, Agnes I, NP  hydrochlorothiazide (HYDRODIURIL) 25 MG tablet Take 25 mg by mouth daily. 06/20/21  Yes [provider]  lisinopril (ZESTRIL) 20 MG tablet Take 20 mg by mouth daily. 06/20/21  Yes [provider]      Allergies    Patient has no known allergies.    Review of Systems   Review of Systems  HENT:  Positive for congestion.   Respiratory:  Positive for cough and shortness of breath.   Cardiovascular:  Positive for chest pain.  All other systems reviewed and are negative.  Physical  Exam Updated Vital Signs BP (!) 153/89    Pulse 100    Temp 98.3 F (36.8 C)    Resp 20    Ht 5\' 9"  (1.753 m)    Wt 91 kg    SpO2 97%    BMI 29.63 kg/m  Physical Exam Vitals and nursing note reviewed.  Constitutional:      Appearance: He is well-developed. He is not ill-appearing or toxic-appearing.  HENT:     Mouth/Throat:     Mouth: Mucous membranes are moist.  Cardiovascular:     Rate and Rhythm: Normal rate and regular rhythm.     Pulses: Normal pulses.  Pulmonary:     Effort: Respiratory distress present.     Breath sounds: Wheezing present.     Comments: Expiratory wheezes noted throughout.  increased work of breathing.  Patient actively coughing during exam. Abdominal:     Palpations: Abdomen is soft.     Tenderness: There is no abdominal tenderness.  Musculoskeletal:        General: Normal range of motion.     Right lower leg: No edema.     Left lower leg: No edema.  Skin:    General: Skin is warm.     Capillary Refill: Capillary refill takes less than 2 seconds.  Neurological:     General: No focal deficit present.     Mental Status: He is alert.  ED Results / Procedures / Treatments   Labs (all labs ordered are listed, but only abnormal results are displayed) Labs Reviewed  CBC WITH DIFFERENTIAL/PLATELET - Abnormal; Notable for the following components:      Result Value   Eosinophils Absolute 0.6 (*)    All other components within normal limits  BASIC METABOLIC PANEL - Abnormal; Notable for the following components:   Glucose, Bld 115 (*)    All other components within normal limits  RESP PANEL BY RT-PCR (FLU A&B, COVID) ARPGX2  TROPONIN I (HIGH SENSITIVITY)  TROPONIN I (HIGH SENSITIVITY)    EKG EKG Interpretation  Date/Time:  Friday August 10 2021 13:44:49 EST Ventricular Rate:  112 PR Interval:  138 QRS Duration: 84 QT Interval:  332 QTC Calculation: 453 R Axis:   89 Text Interpretation: Sinus tachycardia Otherwise normal ECG When compared  with ECG of 07-Feb-2011 13:35, T wave amplitude has decreased in Lateral leads Confirmed by Noemi Chapel [phone]) on 08/10/2021 7:10:17 PM  Radiology DG Chest 2 View  Result Date: 08/10/2021 CLINICAL DATA:  Shortness of breath EXAM: CHEST - 2 VIEW COMPARISON:  05/28/2021 FINDINGS: Unchanged cardiomediastinal silhouette. No focal airspace disease. No pleural effusion. No visible pneumothorax. There is no acute osseous abnormality. IMPRESSION: No evidence of acute cardiopulmonary disease. Electronically Signed   By: Maurine Simmering M.D.   On: 08/10/2021 14:02    Procedures Procedures    Medications Ordered in ED Medications  albuterol (PROVENTIL,VENTOLIN) solution continuous neb (7.5 mg/hr Nebulization Not Given 08/10/21 1645)  albuterol (PROVENTIL) (2.5 MG/3ML) 0.083% nebulizer solution 5 mg (5 mg Nebulization Given 08/10/21 1601)  methylPREDNISolone sodium succinate (SOLU-MEDROL) 125 mg/2 mL injection 125 mg (125 mg Intravenous Given 08/10/21 1559)  albuterol (PROVENTIL) (2.5 MG/3ML) 0.083% nebulizer solution (7.5 mg  Given 08/10/21 1734)    ED Course/ Medical Decision Making/ A&P                           Medical Decision Making Patient with history of asthma here for cough, shortness of breath and chest tightness.  Symptoms began earlier today while at work.  He has been recently moved to a new department where he is exposed to chemical fumes.  He believes that this is triggering his asthma.  Amount and/or Complexity of Data Reviewed Labs: ordered.    Details: Labs ordered and personally reviewed by me, no evidence of leukocytosis, chemistries without derangement.  COVID and influenza testing are negative.  Troponin unremarkable.  EKG without acute ischemic change. Radiology: ordered.    Details: Chest x-ray without evidence of acute cardiopulmonary process.  I personally reviewed imaging and agree with radiology interpretation. ECG/medicine tests: ordered.    Details: sinus  tachycardia  Risk Prescription drug management.  Patient here with likely acute exacerbation of his asthma.  He was given albuterol and Solu-Medrol at time of initial evaluation. On recheck, After receiving medication, lung sounds are improving although he continues to have expiratory wheezes noted.  Will order continuous neb and reassess.  On second recheck, patient now reports feeling better and lung sounds have improved.  He he does continue to have some expiratory wheezes but overall improved from initial exam. Patient has ambulated in the department without hypoxia and maintained oxygen saturation of 94 to 95% on room air.  He did temporarily become tachycardic on ambulation but heart rate improved after brief period of rest.  Denies any chest pain.  Patient offered hospital admission for his  asthma exacerbation but he declines stating that he prefers to go home and he will return if his symptoms worsen.  We will dispense albuterol MDI for home use and prescription written for steroids.  I will also provide local PCP information so that patient may establish primary care.         Final Clinical Impression(s) / ED Diagnoses Final diagnoses:  Moderate asthma with exacerbation, unspecified whether persistent    Rx / DC Orders ED Discharge Orders     None         Bufford Lope 08/10/21 2024    Noemi Chapel, MD 08/11/21 1008

## 2021-08-10 NOTE — ED Notes (Signed)
Ambulated pt. Spo2 stated at 94-95% while ambulating in hall. Pt heart rate was 112-115 while ambulating.
